# Patient Record
Sex: Female | Born: 1950 | Race: White | Hispanic: No | Marital: Married | Smoking: Never smoker
Health system: Southern US, Community
[De-identification: ages and names within clinical notes are randomized; demographics above are authoritative.]

## PROBLEM LIST (undated history)

## (undated) DIAGNOSIS — M549 Dorsalgia, unspecified: Secondary | ICD-10-CM

## (undated) DIAGNOSIS — E039 Hypothyroidism, unspecified: Secondary | ICD-10-CM

## (undated) DIAGNOSIS — M1990 Unspecified osteoarthritis, unspecified site: Secondary | ICD-10-CM

## (undated) DIAGNOSIS — M722 Plantar fascial fibromatosis: Secondary | ICD-10-CM

## (undated) DIAGNOSIS — M542 Cervicalgia: Secondary | ICD-10-CM

## (undated) DIAGNOSIS — R51 Headache: Secondary | ICD-10-CM

## (undated) DIAGNOSIS — R413 Other amnesia: Secondary | ICD-10-CM

## (undated) DIAGNOSIS — K635 Polyp of colon: Secondary | ICD-10-CM

## (undated) DIAGNOSIS — M7989 Other specified soft tissue disorders: Secondary | ICD-10-CM

## (undated) HISTORY — DX: Cervicalgia: M54.2

## (undated) HISTORY — DX: Other specified soft tissue disorders: M79.89

## (undated) HISTORY — DX: Hypothyroidism, unspecified: E03.9

## (undated) HISTORY — DX: Dorsalgia, unspecified: M54.9

## (undated) HISTORY — PX: COLONOSCOPY: SHX174

## (undated) HISTORY — PX: DILATION AND CURETTAGE OF UTERUS: SHX78

## (undated) HISTORY — DX: Headache: R51

## (undated) HISTORY — DX: Other amnesia: R41.3

## (undated) HISTORY — DX: Polyp of colon: K63.5

## (undated) SURGERY — COLONOSCOPY
Anesthesia: Moderate Sedation

---

## 2005-02-28 ENCOUNTER — Other Ambulatory Visit: Admission: RE | Admit: 2005-02-28 | Discharge: 2005-02-28 | Payer: Self-pay | Admitting: *Deleted

## 2005-07-03 ENCOUNTER — Other Ambulatory Visit: Admission: RE | Admit: 2005-07-03 | Discharge: 2005-07-03 | Payer: Self-pay | Admitting: Obstetrics and Gynecology

## 2005-07-15 ENCOUNTER — Ambulatory Visit (HOSPITAL_COMMUNITY): Admission: RE | Admit: 2005-07-15 | Discharge: 2005-07-15 | Payer: Self-pay | Admitting: Obstetrics and Gynecology

## 2005-08-01 ENCOUNTER — Ambulatory Visit (HOSPITAL_COMMUNITY): Admission: RE | Admit: 2005-08-01 | Discharge: 2005-08-01 | Payer: Self-pay | Admitting: Gastroenterology

## 2005-08-01 ENCOUNTER — Encounter (INDEPENDENT_AMBULATORY_CARE_PROVIDER_SITE_OTHER): Payer: Self-pay | Admitting: *Deleted

## 2007-04-30 ENCOUNTER — Encounter: Admission: RE | Admit: 2007-04-30 | Discharge: 2007-04-30 | Payer: Self-pay | Admitting: Obstetrics and Gynecology

## 2007-05-05 ENCOUNTER — Encounter: Admission: RE | Admit: 2007-05-05 | Discharge: 2007-05-05 | Payer: Self-pay | Admitting: Obstetrics and Gynecology

## 2008-08-06 ENCOUNTER — Emergency Department (HOSPITAL_COMMUNITY): Admission: EM | Admit: 2008-08-06 | Discharge: 2008-08-06 | Payer: Self-pay | Admitting: Emergency Medicine

## 2008-11-09 ENCOUNTER — Encounter: Admission: RE | Admit: 2008-11-09 | Discharge: 2008-11-09 | Payer: Self-pay | Admitting: Obstetrics and Gynecology

## 2010-05-09 ENCOUNTER — Encounter: Admission: RE | Admit: 2010-05-09 | Discharge: 2010-05-09 | Payer: Self-pay | Admitting: Obstetrics and Gynecology

## 2011-04-30 ENCOUNTER — Other Ambulatory Visit: Payer: Self-pay | Admitting: Podiatry

## 2011-04-30 DIAGNOSIS — M549 Dorsalgia, unspecified: Secondary | ICD-10-CM

## 2011-05-02 ENCOUNTER — Ambulatory Visit
Admission: RE | Admit: 2011-05-02 | Discharge: 2011-05-02 | Disposition: A | Discharge status: Home / Self Care | Payer: BC Managed Care – PPO | Source: Ambulatory Visit | Attending: Podiatry | Admitting: Podiatry

## 2011-05-02 DIAGNOSIS — M549 Dorsalgia, unspecified: Secondary | ICD-10-CM

## 2011-09-29 ENCOUNTER — Encounter: Payer: Self-pay | Admitting: Vascular Surgery

## 2011-10-07 ENCOUNTER — Encounter: Payer: BC Managed Care – PPO | Admitting: Vascular Surgery

## 2011-10-16 ENCOUNTER — Other Ambulatory Visit: Payer: Self-pay | Admitting: Obstetrics and Gynecology

## 2011-10-16 DIAGNOSIS — Z1231 Encounter for screening mammogram for malignant neoplasm of breast: Secondary | ICD-10-CM

## 2011-11-07 ENCOUNTER — Ambulatory Visit
Admission: RE | Admit: 2011-11-07 | Discharge: 2011-11-07 | Disposition: A | Discharge status: Home / Self Care | Payer: BC Managed Care – PPO | Source: Ambulatory Visit | Attending: Obstetrics and Gynecology | Admitting: Obstetrics and Gynecology

## 2011-11-07 DIAGNOSIS — Z1231 Encounter for screening mammogram for malignant neoplasm of breast: Secondary | ICD-10-CM

## 2011-11-17 ENCOUNTER — Encounter (INDEPENDENT_AMBULATORY_CARE_PROVIDER_SITE_OTHER): Payer: Self-pay | Admitting: Internal Medicine

## 2011-11-17 ENCOUNTER — Ambulatory Visit (INDEPENDENT_AMBULATORY_CARE_PROVIDER_SITE_OTHER): Payer: BC Managed Care – PPO | Admitting: Internal Medicine

## 2011-11-17 DIAGNOSIS — K649 Unspecified hemorrhoids: Secondary | ICD-10-CM

## 2011-11-17 DIAGNOSIS — E039 Hypothyroidism, unspecified: Secondary | ICD-10-CM

## 2011-11-17 MED ORDER — HYDROCORTISONE ACETATE 25 MG RE SUPP: 25.0000 mg | Freq: Two times a day (BID) | RECTAL | Status: AC

## 2011-11-17 MED ORDER — POLYETHYLENE GLYCOL 3350 17 G PO PACK: 17.0000 g | PACK | Freq: Every day | ORAL | Status: AC

## 2011-11-17 NOTE — Patient Instructions (Addendum)
MIralax. PR in 2 weeks.
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
## 2012-10-01 ENCOUNTER — Other Ambulatory Visit: Payer: Self-pay | Admitting: Obstetrics and Gynecology

## 2012-10-01 DIAGNOSIS — Z1231 Encounter for screening mammogram for malignant neoplasm of breast: Secondary | ICD-10-CM

## 2012-11-12 ENCOUNTER — Ambulatory Visit (HOSPITAL_COMMUNITY)
Admission: RE | Admit: 2012-11-12 | Discharge: 2012-11-12 | Disposition: A | Discharge status: Home / Self Care | Payer: BC Managed Care – PPO | Source: Ambulatory Visit | Attending: Obstetrics and Gynecology | Admitting: Obstetrics and Gynecology

## 2012-11-12 DIAGNOSIS — Z1231 Encounter for screening mammogram for malignant neoplasm of breast: Secondary | ICD-10-CM | POA: Insufficient documentation

## 2013-10-12 ENCOUNTER — Other Ambulatory Visit (HOSPITAL_COMMUNITY): Payer: Self-pay | Admitting: Internal Medicine

## 2013-10-12 DIAGNOSIS — Z1231 Encounter for screening mammogram for malignant neoplasm of breast: Secondary | ICD-10-CM

## 2013-11-25 ENCOUNTER — Ambulatory Visit (HOSPITAL_COMMUNITY)
Admission: RE | Admit: 2013-11-25 | Discharge: 2013-11-25 | Disposition: A | Discharge status: Home / Self Care | Payer: BC Managed Care – PPO | Source: Ambulatory Visit | Attending: Internal Medicine | Admitting: Internal Medicine

## 2013-11-25 DIAGNOSIS — Z1231 Encounter for screening mammogram for malignant neoplasm of breast: Secondary | ICD-10-CM

## 2015-01-08 ENCOUNTER — Other Ambulatory Visit (HOSPITAL_COMMUNITY): Payer: Self-pay | Admitting: Obstetrics and Gynecology

## 2015-01-08 DIAGNOSIS — Z1231 Encounter for screening mammogram for malignant neoplasm of breast: Secondary | ICD-10-CM

## 2015-01-12 ENCOUNTER — Ambulatory Visit (HOSPITAL_COMMUNITY): Admission: RE | Admit: 2015-01-12 | Payer: Self-pay | Source: Ambulatory Visit

## 2015-01-17 ENCOUNTER — Ambulatory Visit (HOSPITAL_COMMUNITY)
Admission: RE | Admit: 2015-01-17 | Discharge: 2015-01-17 | Disposition: A | Discharge status: Home / Self Care | Payer: 59 | Source: Ambulatory Visit | Attending: Obstetrics and Gynecology | Admitting: Obstetrics and Gynecology

## 2015-01-17 DIAGNOSIS — Z1231 Encounter for screening mammogram for malignant neoplasm of breast: Secondary | ICD-10-CM | POA: Diagnosis present

## 2015-09-23 ENCOUNTER — Emergency Department (HOSPITAL_COMMUNITY): Payer: BLUE CROSS/BLUE SHIELD

## 2015-09-23 ENCOUNTER — Emergency Department (HOSPITAL_COMMUNITY)
Admission: EM | Admit: 2015-09-23 | Discharge: 2015-09-24 | Disposition: A | Discharge status: Home / Self Care | Payer: BLUE CROSS/BLUE SHIELD | Attending: Emergency Medicine | Admitting: Emergency Medicine

## 2015-09-23 ENCOUNTER — Encounter (HOSPITAL_COMMUNITY): Payer: Self-pay | Admitting: *Deleted

## 2015-09-23 DIAGNOSIS — Z8739 Personal history of other diseases of the musculoskeletal system and connective tissue: Secondary | ICD-10-CM | POA: Diagnosis not present

## 2015-09-23 DIAGNOSIS — E039 Hypothyroidism, unspecified: Secondary | ICD-10-CM | POA: Insufficient documentation

## 2015-09-23 DIAGNOSIS — Z79899 Other long term (current) drug therapy: Secondary | ICD-10-CM | POA: Diagnosis not present

## 2015-09-23 DIAGNOSIS — N390 Urinary tract infection, site not specified: Secondary | ICD-10-CM | POA: Diagnosis not present

## 2015-09-23 DIAGNOSIS — R1011 Right upper quadrant pain: Secondary | ICD-10-CM | POA: Diagnosis not present

## 2015-09-23 DIAGNOSIS — R197 Diarrhea, unspecified: Secondary | ICD-10-CM | POA: Insufficient documentation

## 2015-09-23 LAB — CBC WITH DIFFERENTIAL/PLATELET
BASOS ABS: 0 10*3/uL (ref 0.0–0.1)
Basophils Relative: 1 %
EOS ABS: 0.2 10*3/uL (ref 0.0–0.7)
EOS PCT: 3 %
HCT: 40.3 % (ref 36.0–46.0)
HEMOGLOBIN: 13.9 g/dL (ref 12.0–15.0)
Lymphocytes Relative: 33 %
Lymphs Abs: 2.2 10*3/uL (ref 0.7–4.0)
MCH: 32.7 pg (ref 26.0–34.0)
MCHC: 34.5 g/dL (ref 30.0–36.0)
MCV: 94.8 fL (ref 78.0–100.0)
Monocytes Absolute: 0.6 10*3/uL (ref 0.1–1.0)
Monocytes Relative: 9 %
NEUTROS PCT: 54 %
Neutro Abs: 3.6 10*3/uL (ref 1.7–7.7)
PLATELETS: 260 10*3/uL (ref 150–400)
RBC: 4.25 MIL/uL (ref 3.87–5.11)
RDW: 12.8 % (ref 11.5–15.5)
WBC: 6.6 10*3/uL (ref 4.0–10.5)

## 2015-09-23 LAB — URINALYSIS, ROUTINE W REFLEX MICROSCOPIC
BILIRUBIN URINE: NEGATIVE
Glucose, UA: NEGATIVE mg/dL
Hgb urine dipstick: NEGATIVE
Ketones, ur: NEGATIVE mg/dL
NITRITE: NEGATIVE
PH: 6.5 (ref 5.0–8.0)
Protein, ur: NEGATIVE mg/dL
SPECIFIC GRAVITY, URINE: 1.015 (ref 1.005–1.030)
UROBILINOGEN UA: 0.2 mg/dL (ref 0.0–1.0)

## 2015-09-23 LAB — COMPREHENSIVE METABOLIC PANEL
ALBUMIN: 4.2 g/dL (ref 3.5–5.0)
ALK PHOS: 112 U/L (ref 38–126)
ALT: 35 U/L (ref 14–54)
AST: 37 U/L (ref 15–41)
Anion gap: 9 (ref 5–15)
BUN: 18 mg/dL (ref 6–20)
CHLORIDE: 104 mmol/L (ref 101–111)
CO2: 26 mmol/L (ref 22–32)
Calcium: 9.5 mg/dL (ref 8.9–10.3)
Creatinine, Ser: 0.69 mg/dL (ref 0.44–1.00)
GFR calc Af Amer: >60 mL/min (ref >60)
GFR calc non Af Amer: >60 mL/min (ref >60)
GLUCOSE: 146 mg/dL — AB (ref 65–99)
Potassium: 3.8 mmol/L (ref 3.5–5.1)
SODIUM: 139 mmol/L (ref 135–145)
Total Bilirubin: 0.3 mg/dL (ref 0.3–1.2)
Total Protein: 7.5 g/dL (ref 6.5–8.1)

## 2015-09-23 LAB — URINE MICROSCOPIC-ADD ON

## 2015-09-23 LAB — LIPASE, BLOOD: Lipase: 31 U/L (ref 22–51)

## 2015-09-23 MED ORDER — IOHEXOL 300 MG/ML  SOLN
25.0000 mL | Freq: Once | INTRAMUSCULAR | Status: AC | PRN
  Administered 2015-09-23: 25 mL via ORAL

## 2015-09-23 MED ORDER — HYDROCODONE-ACETAMINOPHEN 5-325 MG PO TABS: 2.0000 | ORAL_TABLET | ORAL | Status: DC | PRN

## 2015-09-23 MED ORDER — SODIUM CHLORIDE 0.9 % IV BOLUS (SEPSIS)
500.0000 mL | Freq: Once | INTRAVENOUS | Status: AC
  Administered 2015-09-23: 20:00:00 via INTRAVENOUS

## 2015-09-23 MED ORDER — CEPHALEXIN 500 MG PO CAPS: 500.0000 mg | ORAL_CAPSULE | Freq: Four times a day (QID) | ORAL | Status: DC

## 2015-09-23 MED ORDER — ONDANSETRON HCL 4 MG PO TABS: 4.0000 mg | ORAL_TABLET | Freq: Four times a day (QID) | ORAL | Status: DC

## 2015-09-23 MED ORDER — IOHEXOL 300 MG/ML  SOLN
100.0000 mL | Freq: Once | INTRAMUSCULAR | Status: AC | PRN
  Administered 2015-09-23: 100 mL via INTRAVENOUS

## 2015-09-23 MED ORDER — RANITIDINE HCL 150 MG PO TABS: 150.0000 mg | ORAL_TABLET | Freq: Two times a day (BID) | ORAL | Status: DC

## 2015-09-23 MED ORDER — ONDANSETRON HCL 4 MG/2ML IJ SOLN
4.0000 mg | Freq: Once | INTRAMUSCULAR | Status: AC
  Administered 2015-09-23: 4 mg via INTRAVENOUS
  Filled 2015-09-23: qty 2

## 2015-09-23 MED ORDER — HYDROMORPHONE HCL 1 MG/ML IJ SOLN
1.0000 mg | Freq: Once | INTRAMUSCULAR | Status: AC
  Administered 2015-09-23: 1 mg via INTRAVENOUS
  Filled 2015-09-23: qty 1
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
## 2015-09-23 NOTE — ED Notes (Signed)
Patient now moving only his right side  And keeping his eyes open but still not talking.

## 2015-09-23 NOTE — ED Notes (Addendum)
Pt c/o right side pain x 2 days with nausea.

## 2015-09-23 NOTE — Discharge Instructions (Signed)

## 2016-09-19 ENCOUNTER — Encounter (INDEPENDENT_AMBULATORY_CARE_PROVIDER_SITE_OTHER): Payer: Self-pay | Admitting: Internal Medicine

## 2016-09-29 ENCOUNTER — Encounter (INDEPENDENT_AMBULATORY_CARE_PROVIDER_SITE_OTHER): Payer: Self-pay | Admitting: Internal Medicine

## 2016-09-29 ENCOUNTER — Ambulatory Visit (INDEPENDENT_AMBULATORY_CARE_PROVIDER_SITE_OTHER): Payer: BLUE CROSS/BLUE SHIELD | Admitting: Internal Medicine

## 2016-09-29 ENCOUNTER — Other Ambulatory Visit (INDEPENDENT_AMBULATORY_CARE_PROVIDER_SITE_OTHER): Payer: Self-pay | Admitting: *Deleted

## 2016-09-29 VITALS — BP 152/70 | HR 76 | Temp 97.4°F | Ht 67.0 in | Wt 244.4 lb

## 2016-09-29 DIAGNOSIS — D126 Benign neoplasm of colon, unspecified: Secondary | ICD-10-CM | POA: Diagnosis not present

## 2016-09-29 NOTE — Addendum Note (Signed)
Addended by: Butch Penny on: 09/29/2016 12:18 PM   Modules accepted: Orders

## 2016-09-29 NOTE — Patient Instructions (Signed)
Colonoscopy.  The risks and benefits such as perforation, bleeding, and infection were reviewed with the patient and is agreeable. 
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
## 2016-10-10 ENCOUNTER — Other Ambulatory Visit (INDEPENDENT_AMBULATORY_CARE_PROVIDER_SITE_OTHER): Payer: Self-pay | Admitting: Internal Medicine

## 2016-10-10 ENCOUNTER — Telehealth (INDEPENDENT_AMBULATORY_CARE_PROVIDER_SITE_OTHER): Payer: Self-pay | Admitting: Internal Medicine

## 2016-10-10 DIAGNOSIS — Z86010 Personal history of colonic polyps: Secondary | ICD-10-CM

## 2016-10-10 NOTE — Telephone Encounter (Signed)
Colonoscopy with colowrap

## 2016-10-13 NOTE — Telephone Encounter (Signed)
Per Terri cancel TCS, she spoke to patient and patient states she isn't having any problems and does not want TCS -- TCS has been canceled

## 2016-10-15 NOTE — Telephone Encounter (Signed)
error 

## 2016-11-10 ENCOUNTER — Encounter (INDEPENDENT_AMBULATORY_CARE_PROVIDER_SITE_OTHER): Payer: Self-pay

## 2018-01-07 DIAGNOSIS — M6702 Short Achilles tendon (acquired), left ankle: Secondary | ICD-10-CM | POA: Diagnosis not present

## 2018-01-07 DIAGNOSIS — M722 Plantar fascial fibromatosis: Secondary | ICD-10-CM | POA: Diagnosis not present

## 2018-01-07 DIAGNOSIS — B353 Tinea pedis: Secondary | ICD-10-CM | POA: Diagnosis not present

## 2018-02-09 ENCOUNTER — Telehealth (INDEPENDENT_AMBULATORY_CARE_PROVIDER_SITE_OTHER): Payer: Self-pay | Admitting: *Deleted

## 2018-02-09 NOTE — Telephone Encounter (Signed)
OV sch'd 02/15/18, left detailed message for patient

## 2018-02-09 NOTE — Telephone Encounter (Signed)
Patient left message wanting to schedule TCS -- looks like we had her schedule endo of last year but you had me cancel -- please advise

## 2018-02-09 NOTE — Telephone Encounter (Signed)
Needs OV before we schedule

## 2018-02-15 ENCOUNTER — Encounter (INDEPENDENT_AMBULATORY_CARE_PROVIDER_SITE_OTHER): Payer: Self-pay | Admitting: Internal Medicine

## 2018-02-15 ENCOUNTER — Ambulatory Visit (INDEPENDENT_AMBULATORY_CARE_PROVIDER_SITE_OTHER): Payer: Medicare HMO | Admitting: Internal Medicine

## 2018-02-15 ENCOUNTER — Telehealth (INDEPENDENT_AMBULATORY_CARE_PROVIDER_SITE_OTHER): Payer: Self-pay | Admitting: *Deleted

## 2018-02-15 ENCOUNTER — Encounter (INDEPENDENT_AMBULATORY_CARE_PROVIDER_SITE_OTHER): Payer: Self-pay | Admitting: *Deleted

## 2018-02-15 VITALS — BP 160/90 | HR 64 | Temp 98.0°F | Ht 67.0 in | Wt 232.0 lb

## 2018-02-15 DIAGNOSIS — Z86010 Personal history of colonic polyps: Secondary | ICD-10-CM | POA: Diagnosis not present

## 2018-02-15 DIAGNOSIS — K625 Hemorrhage of anus and rectum: Secondary | ICD-10-CM

## 2018-02-15 MED ORDER — PEG 3350-KCL-NA BICARB-NACL 420 G PO SOLR: 4000.0000 mL | Freq: Once | ORAL | 0 refills | Status: AC

## 2018-02-15 NOTE — Telephone Encounter (Signed)
Patient need trilyte 

## 2018-02-15 NOTE — Patient Instructions (Signed)
The risks of bleeding, perforation and infection were reviewed with patient.  
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
## 2018-02-26 ENCOUNTER — Other Ambulatory Visit: Payer: Self-pay

## 2018-02-26 ENCOUNTER — Ambulatory Visit (HOSPITAL_COMMUNITY)
Admission: RE | Admit: 2018-02-26 | Discharge: 2018-02-26 | Disposition: A | Discharge status: Home / Self Care | Payer: Medicare HMO | Source: Ambulatory Visit | Attending: Internal Medicine | Admitting: Internal Medicine

## 2018-02-26 ENCOUNTER — Encounter (HOSPITAL_COMMUNITY): Payer: Self-pay | Admitting: *Deleted

## 2018-02-26 ENCOUNTER — Encounter (HOSPITAL_COMMUNITY)
Admission: RE | Disposition: A | Discharge status: Home / Self Care | Payer: Self-pay | Source: Ambulatory Visit | Attending: Internal Medicine

## 2018-02-26 DIAGNOSIS — Z79899 Other long term (current) drug therapy: Secondary | ICD-10-CM | POA: Insufficient documentation

## 2018-02-26 DIAGNOSIS — Z79890 Hormone replacement therapy (postmenopausal): Secondary | ICD-10-CM | POA: Diagnosis not present

## 2018-02-26 DIAGNOSIS — Z86010 Personal history of colonic polyps: Secondary | ICD-10-CM | POA: Insufficient documentation

## 2018-02-26 DIAGNOSIS — K648 Other hemorrhoids: Secondary | ICD-10-CM | POA: Insufficient documentation

## 2018-02-26 DIAGNOSIS — Z91048 Other nonmedicinal substance allergy status: Secondary | ICD-10-CM | POA: Insufficient documentation

## 2018-02-26 DIAGNOSIS — K644 Residual hemorrhoidal skin tags: Secondary | ICD-10-CM | POA: Diagnosis not present

## 2018-02-26 DIAGNOSIS — Z1211 Encounter for screening for malignant neoplasm of colon: Secondary | ICD-10-CM | POA: Diagnosis not present

## 2018-02-26 DIAGNOSIS — Z09 Encounter for follow-up examination after completed treatment for conditions other than malignant neoplasm: Secondary | ICD-10-CM | POA: Diagnosis not present

## 2018-02-26 DIAGNOSIS — E039 Hypothyroidism, unspecified: Secondary | ICD-10-CM | POA: Insufficient documentation

## 2018-02-26 HISTORY — DX: Unspecified osteoarthritis, unspecified site: M19.90

## 2018-02-26 HISTORY — PX: COLONOSCOPY: SHX5424

## 2018-02-26 HISTORY — DX: Plantar fascial fibromatosis: M72.2

## 2018-02-26 SURGERY — COLONOSCOPY
Anesthesia: Moderate Sedation

## 2018-02-26 MED ORDER — MIDAZOLAM HCL 5 MG/5ML IJ SOLN
INTRAMUSCULAR | Status: DC | PRN
  Administered 2018-02-26 (×3): 2 mg via INTRAVENOUS

## 2018-02-26 MED ORDER — MEPERIDINE HCL 50 MG/ML IJ SOLN
INTRAMUSCULAR | Status: DC | PRN
  Administered 2018-02-26 (×2): 25 mg

## 2018-02-26 MED ORDER — MEPERIDINE HCL 50 MG/ML IJ SOLN
INTRAMUSCULAR | Status: AC
  Filled 2018-02-26: qty 1

## 2018-02-26 MED ORDER — HYDROCORTISONE ACETATE 25 MG RE SUPP: 25.0000 mg | Freq: Every day | RECTAL | 1 refills | Status: DC

## 2018-02-26 MED ORDER — MIDAZOLAM HCL 5 MG/5ML IJ SOLN
INTRAMUSCULAR | Status: AC
  Filled 2018-02-26: qty 10

## 2018-02-26 MED ORDER — SODIUM CHLORIDE 0.9 % IV SOLN
INTRAVENOUS | Status: DC
  Administered 2018-02-26: 10:00:00 via INTRAVENOUS

## 2018-02-26 NOTE — Discharge Instructions (Signed)
Resume usual medications and diet as before. Anusol HC suppository 1 per rectum daily at bedtime for 2 weeks. No driving for 24 hours. Keep symptom diary as to frequency of bleeding episodes for the next 1 month and call with progress report.   Colonoscopy, Adult, Care After This sheet gives you information about how to care for yourself after your procedure. Your health care provider may also give you more specific instructions. If you have problems or questions, contact your health care provider. Dr Laural Golden:  [phone].  After hours and weekends call the hospital and have the GI doctor on call paged; They will call you back. What can I expect after the procedure? After the procedure, it is common to have:  A small amount of blood in your stool for 24 hours after the procedure.  Some gas.  Mild abdominal cramping or bloating.  Follow these instructions at home: General instructions   For the first 24 hours after the procedure: ? Do not drive or use machinery. ? Do not sign important documents. ? Do not drink alcohol. ? Rest often.  Take over-the-counter or prescription medicines only as told by your health care provider. Relieving cramping and bloating  Try walking around when you have cramps or feel bloated. Eating and drinking  Drink enough fluid to keep your urine clear or pale yellow.  Resume your normal diet as instructed by your health care provider.  Avoid drinking alcohol for as long as instructed by your health care provider. Contact a health care provider if:  You have blood in your stool 2-3 days after the procedure. Get help right away if:  You have more than a small spotting of blood in your stool.  You pass large blood clots in your stool.  Your abdomen is swollen.  You have nausea or vomiting.  You have a fever.  You have increasing abdominal pain that is not relieved with medicine. This information is not intended to replace advice given to you  by your health care provider. Make sure you discuss any questions you have with your health care provider. Document Released: 07/22/2004 Document Revised: 09/01/2016 Document Reviewed: 02/19/2016 Elsevier Interactive Patient Education  [employer].
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
## 2018-02-26 NOTE — Op Note (Signed)
[facility] Patient Name: Donna Gaines Procedure Date: 02/26/2018 10:07 AM MRN: [id number] Date of Birth: 1951/12/11 Attending MD: Hildred Laser , MD CSN: [id number] Age: 67 Admit Type: Outpatient Procedure:                Colonoscopy Indications:              High risk colon cancer surveillance: Personal                            history of colonic polyps Providers:                Hildred Laser, MD, Janeece Riggers, RN, Rosina Lowenstein, RN Referring MD:             Glenda Chroman, MD Medicines:                Meperidine 50 mg IV, Midazolam 6 mg IV Complications:            No immediate complications. Estimated Blood Loss:     Estimated blood loss: none. Procedure:                Pre-Anesthesia Assessment:                           - Prior to the procedure, a History and Physical                            was performed, and patient medications and                            allergies were reviewed. The patient's tolerance of                            previous anesthesia was also reviewed. The risks                            and benefits of the procedure and the sedation                            options and risks were discussed with the patient.                            All questions were answered, and informed consent                            was obtained. Prior Anticoagulants: The patient has                            taken no previous anticoagulant or antiplatelet                            agents. ASA Grade Assessment: I - A normal, healthy                            patient. After reviewing the risks and benefits,  the patient was deemed in satisfactory condition to                            undergo the procedure.                           After obtaining informed consent, the colonoscope                            was passed under direct vision. Throughout the                            procedure, the patient's blood pressure, pulse, and                          oxygen saturations were monitored continuously. The                            EC-[id number] ([id number]) scope was introduced through                            the anus and advanced to the the cecum, identified                            by appendiceal orifice and ileocecal valve. The                            quality of the bowel preparation was good. The                            ileocecal valve, appendiceal orifice, and rectum                            were photographed. The colonoscopy was technically                            difficult and complex due to a tortuous colon.                            Successful completion of the procedure was aided by                            increasing the dose of sedation medication,                            changing the patient to a supine position, using                            manual pressure and withdrawing and reinserting the                            scope. Scope In: 10:35:58 AM Scope Out: 11:02:53 AM Scope Withdrawal Time: 0 hours 9 minutes 4 seconds  Total Procedure Duration: 0 hours 26 minutes 55 seconds  Findings:      The perianal and digital rectal examinations were normal.      The colon (entire examined portion) appeared normal.      External and internal hemorrhoids were found during retroflexion. The       hemorrhoids were small. Impression:               - The entire examined colon is normal.                           - External and internal hemorrhoids.                           - No specimens collected. Moderate Sedation:      Moderate (conscious) sedation was administered by the endoscopy nurse       and supervised by the endoscopist. The following parameters were       monitored: oxygen saturation, heart rate, blood pressure, CO2       capnography and response to care. Total physician intraservice time was       32 minutes. Recommendation:           - Patient has a contact number available for                             emergencies. The signs and symptoms of potential                            delayed complications were discussed with the                            patient. Return to normal activities tomorrow.                            Written discharge instructions were provided to the                            patient.                           - Resume previous diet today.                           - Continue present medications.                           - Anusol- HC supp one per rectum qhs for 2 weeks.                           - Repeat colonoscopy in 10 years. Procedure Code(s):        --- Professional ---                           [id number], Colonoscopy, flexible; diagnostic, including                            collection of specimen(s) by brushing or washing,  when performed (separate procedure)                           99152, Moderate sedation services provided by the                            same physician or other qualified health care                            professional performing the diagnostic or                            therapeutic service that the sedation supports,                            requiring the presence of an independent trained                            observer to assist in the monitoring of the                            patient's level of consciousness and physiological                            status; initial 15 minutes of intraservice time,                            patient age 42 years or older                           [phone], Moderate sedation services; each additional                            15 minutes intraservice time Diagnosis Code(s):        --- Professional ---                           Z86.010, Personal history of colonic polyps                           K64.8, Other hemorrhoids CPT copyright 2016 American [employer] Association. All rights reserved. The codes documented in this report are  preliminary and upon coder review may  be revised to meet current compliance requirements. Hildred Laser, MD Hildred Laser, MD 02/26/2018 11:11:33 AM This report has been signed electronically. Number of Addenda: 0

## 2018-03-03 ENCOUNTER — Encounter (HOSPITAL_COMMUNITY): Payer: Self-pay | Admitting: Internal Medicine

## 2018-03-04 DIAGNOSIS — R69 Illness, unspecified: Secondary | ICD-10-CM | POA: Diagnosis not present

## 2018-07-28 DIAGNOSIS — Z1339 Encounter for screening examination for other mental health and behavioral disorders: Secondary | ICD-10-CM | POA: Diagnosis not present

## 2018-07-28 DIAGNOSIS — E039 Hypothyroidism, unspecified: Secondary | ICD-10-CM | POA: Diagnosis not present

## 2018-07-28 DIAGNOSIS — Z299 Encounter for prophylactic measures, unspecified: Secondary | ICD-10-CM | POA: Diagnosis not present

## 2018-07-28 DIAGNOSIS — Z6836 Body mass index (BMI) 36.0-36.9, adult: Secondary | ICD-10-CM | POA: Diagnosis not present

## 2018-07-28 DIAGNOSIS — Z0000 Encounter for general adult medical examination without abnormal findings: Secondary | ICD-10-CM | POA: Diagnosis not present

## 2018-07-28 DIAGNOSIS — Z79899 Other long term (current) drug therapy: Secondary | ICD-10-CM | POA: Diagnosis not present

## 2018-07-28 DIAGNOSIS — Z1331 Encounter for screening for depression: Secondary | ICD-10-CM | POA: Diagnosis not present

## 2018-07-28 DIAGNOSIS — E7800 Pure hypercholesterolemia, unspecified: Secondary | ICD-10-CM | POA: Diagnosis not present

## 2018-07-28 DIAGNOSIS — Z7189 Other specified counseling: Secondary | ICD-10-CM | POA: Diagnosis not present

## 2019-07-29 DIAGNOSIS — Z0100 Encounter for examination of eyes and vision without abnormal findings: Secondary | ICD-10-CM | POA: Diagnosis not present

## 2019-10-04 DIAGNOSIS — E7800 Pure hypercholesterolemia, unspecified: Secondary | ICD-10-CM | POA: Diagnosis not present

## 2019-10-04 DIAGNOSIS — Z6838 Body mass index (BMI) 38.0-38.9, adult: Secondary | ICD-10-CM | POA: Diagnosis not present

## 2019-10-04 DIAGNOSIS — E039 Hypothyroidism, unspecified: Secondary | ICD-10-CM | POA: Diagnosis not present

## 2019-10-04 DIAGNOSIS — E2839 Other primary ovarian failure: Secondary | ICD-10-CM | POA: Diagnosis not present

## 2019-10-04 DIAGNOSIS — Z1331 Encounter for screening for depression: Secondary | ICD-10-CM | POA: Diagnosis not present

## 2019-10-04 DIAGNOSIS — Z1339 Encounter for screening examination for other mental health and behavioral disorders: Secondary | ICD-10-CM | POA: Diagnosis not present

## 2019-10-04 DIAGNOSIS — Z79899 Other long term (current) drug therapy: Secondary | ICD-10-CM | POA: Diagnosis not present

## 2019-10-04 DIAGNOSIS — Z0000 Encounter for general adult medical examination without abnormal findings: Secondary | ICD-10-CM | POA: Diagnosis not present

## 2019-10-04 DIAGNOSIS — Z7189 Other specified counseling: Secondary | ICD-10-CM | POA: Diagnosis not present

## 2019-10-04 DIAGNOSIS — Z299 Encounter for prophylactic measures, unspecified: Secondary | ICD-10-CM | POA: Diagnosis not present

## 2019-10-06 ENCOUNTER — Other Ambulatory Visit: Payer: Self-pay | Admitting: Nurse Practitioner

## 2019-10-06 DIAGNOSIS — Z1231 Encounter for screening mammogram for malignant neoplasm of breast: Secondary | ICD-10-CM

## 2019-10-12 DIAGNOSIS — E1165 Type 2 diabetes mellitus with hyperglycemia: Secondary | ICD-10-CM | POA: Diagnosis not present

## 2019-10-12 DIAGNOSIS — E785 Hyperlipidemia, unspecified: Secondary | ICD-10-CM | POA: Diagnosis not present

## 2019-10-12 DIAGNOSIS — E039 Hypothyroidism, unspecified: Secondary | ICD-10-CM | POA: Diagnosis not present

## 2019-10-12 DIAGNOSIS — Z789 Other specified health status: Secondary | ICD-10-CM | POA: Diagnosis not present

## 2019-10-12 DIAGNOSIS — Z299 Encounter for prophylactic measures, unspecified: Secondary | ICD-10-CM | POA: Diagnosis not present

## 2019-10-12 DIAGNOSIS — Z6838 Body mass index (BMI) 38.0-38.9, adult: Secondary | ICD-10-CM | POA: Diagnosis not present

## 2019-10-19 ENCOUNTER — Ambulatory Visit
Admission: RE | Admit: 2019-10-19 | Discharge: 2019-10-19 | Disposition: A | Discharge status: Home / Self Care | Payer: Medicare HMO | Source: Ambulatory Visit | Attending: Nurse Practitioner | Admitting: Nurse Practitioner

## 2019-10-19 ENCOUNTER — Other Ambulatory Visit: Payer: Self-pay

## 2019-10-19 DIAGNOSIS — Z1231 Encounter for screening mammogram for malignant neoplasm of breast: Secondary | ICD-10-CM

## 2019-11-03 DIAGNOSIS — E119 Type 2 diabetes mellitus without complications: Secondary | ICD-10-CM | POA: Diagnosis not present

## 2019-11-22 DIAGNOSIS — R69 Illness, unspecified: Secondary | ICD-10-CM | POA: Diagnosis not present

## 2020-01-08 DIAGNOSIS — R69 Illness, unspecified: Secondary | ICD-10-CM | POA: Diagnosis not present

## 2020-01-20 DIAGNOSIS — Z6837 Body mass index (BMI) 37.0-37.9, adult: Secondary | ICD-10-CM | POA: Diagnosis not present

## 2020-01-20 DIAGNOSIS — E1165 Type 2 diabetes mellitus with hyperglycemia: Secondary | ICD-10-CM | POA: Diagnosis not present

## 2020-01-20 DIAGNOSIS — Z713 Dietary counseling and surveillance: Secondary | ICD-10-CM | POA: Diagnosis not present

## 2020-01-20 DIAGNOSIS — Z79899 Other long term (current) drug therapy: Secondary | ICD-10-CM | POA: Diagnosis not present

## 2020-01-20 DIAGNOSIS — E7800 Pure hypercholesterolemia, unspecified: Secondary | ICD-10-CM | POA: Diagnosis not present

## 2020-01-20 DIAGNOSIS — Z299 Encounter for prophylactic measures, unspecified: Secondary | ICD-10-CM | POA: Diagnosis not present

## 2020-02-01 ENCOUNTER — Ambulatory Visit: Payer: Medicare HMO | Attending: Internal Medicine

## 2020-02-01 ENCOUNTER — Other Ambulatory Visit: Payer: Self-pay

## 2020-02-01 DIAGNOSIS — Z20822 Contact with and (suspected) exposure to covid-19: Secondary | ICD-10-CM

## 2020-02-02 LAB — NOVEL CORONAVIRUS, NAA: SARS-CoV-2, NAA: NOT DETECTED

## 2020-02-16 DIAGNOSIS — E1165 Type 2 diabetes mellitus with hyperglycemia: Secondary | ICD-10-CM | POA: Diagnosis not present

## 2020-03-01 DIAGNOSIS — Z2821 Immunization not carried out because of patient refusal: Secondary | ICD-10-CM | POA: Diagnosis not present

## 2020-03-01 DIAGNOSIS — Z299 Encounter for prophylactic measures, unspecified: Secondary | ICD-10-CM | POA: Diagnosis not present

## 2020-03-01 DIAGNOSIS — E7800 Pure hypercholesterolemia, unspecified: Secondary | ICD-10-CM | POA: Diagnosis not present

## 2020-03-01 DIAGNOSIS — Z789 Other specified health status: Secondary | ICD-10-CM | POA: Diagnosis not present

## 2020-03-20 DIAGNOSIS — E1165 Type 2 diabetes mellitus with hyperglycemia: Secondary | ICD-10-CM | POA: Diagnosis not present

## 2020-04-02 DIAGNOSIS — Z299 Encounter for prophylactic measures, unspecified: Secondary | ICD-10-CM | POA: Diagnosis not present

## 2020-04-02 DIAGNOSIS — J45909 Unspecified asthma, uncomplicated: Secondary | ICD-10-CM | POA: Diagnosis not present

## 2020-04-02 DIAGNOSIS — Z713 Dietary counseling and surveillance: Secondary | ICD-10-CM | POA: Diagnosis not present

## 2020-04-02 DIAGNOSIS — Z6836 Body mass index (BMI) 36.0-36.9, adult: Secondary | ICD-10-CM | POA: Diagnosis not present

## 2020-04-18 ENCOUNTER — Encounter: Payer: Self-pay | Admitting: Pulmonary Disease

## 2020-04-18 ENCOUNTER — Ambulatory Visit: Payer: Medicare HMO | Admitting: Pulmonary Disease

## 2020-04-18 ENCOUNTER — Other Ambulatory Visit: Payer: Self-pay

## 2020-04-18 DIAGNOSIS — J4530 Mild persistent asthma, uncomplicated: Secondary | ICD-10-CM | POA: Diagnosis not present

## 2020-04-18 DIAGNOSIS — J45909 Unspecified asthma, uncomplicated: Secondary | ICD-10-CM | POA: Insufficient documentation

## 2020-04-18 MED ORDER — ALBUTEROL SULFATE HFA 108 (90 BASE) MCG/ACT IN AERS: 2.0000 | INHALATION_SPRAY | Freq: Four times a day (QID) | RESPIRATORY_TRACT | 3 refills | Status: DC | PRN
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
## 2020-04-18 NOTE — Assessment & Plan Note (Addendum)
may have reactive airway disease or rarely it is possible that piece of corn is still there aspirated in the lung Triggers- PND, reflux, cats  I offered her bronchoscopy but se prefers to do non invasive measures first.  Trial of albuterol MDI 2 puffs every 6 hours as needed -use spacer with this Report back in 2 weeks  If no improvement, can add steroid inhaler or consider CT scan and bronchoscopy as we discussed If persistent & above wu neg then will consider allergy testing

## 2020-04-18 NOTE — Patient Instructions (Signed)
You may have reactive airway disease or rarely it is possible that piece of corn is still there aspirated in the lung  Trial of albuterol MDI 2 puffs every 6 hours as needed -use spacer with this Report back in 2 weeks  If no improvement, can add steroid inhaler or consider CT scan and bronchoscopy as we discussed

## 2020-04-18 NOTE — Addendum Note (Signed)
Addended by: Lia Foyer R on: 04/18/2020 10:08 AM   Modules accepted: Orders

## 2020-04-20 DIAGNOSIS — E1165 Type 2 diabetes mellitus with hyperglycemia: Secondary | ICD-10-CM | POA: Diagnosis not present

## 2020-04-27 DIAGNOSIS — E039 Hypothyroidism, unspecified: Secondary | ICD-10-CM | POA: Diagnosis not present

## 2020-04-27 DIAGNOSIS — Z6836 Body mass index (BMI) 36.0-36.9, adult: Secondary | ICD-10-CM | POA: Diagnosis not present

## 2020-04-27 DIAGNOSIS — E1165 Type 2 diabetes mellitus with hyperglycemia: Secondary | ICD-10-CM | POA: Diagnosis not present

## 2020-04-27 DIAGNOSIS — Z299 Encounter for prophylactic measures, unspecified: Secondary | ICD-10-CM | POA: Diagnosis not present

## 2020-05-07 ENCOUNTER — Telehealth: Payer: Self-pay | Admitting: Pulmonary Disease

## 2020-05-07 DIAGNOSIS — J4530 Mild persistent asthma, uncomplicated: Secondary | ICD-10-CM

## 2020-05-07 NOTE — Telephone Encounter (Signed)
Instructions from last OV 04/18/20 with RA    Return in about 4 weeks (around 05/16/2020).  You may have reactive airway disease or rarely it is possible that piece of corn is still there aspirated in the lung  Trial of albuterol MDI 2 puffs every 6 hours as needed -use spacer with this Report back in 2 weeks  If no improvement, can add steroid inhaler or consider CT scan and bronchoscopy as we discussed     Called pt but unable to reach. Left message for her to return call.

## 2020-05-07 NOTE — Telephone Encounter (Signed)
Has been taking mucinex to see if that would help

## 2020-05-08 NOTE — Telephone Encounter (Signed)
Spoke with the pt. She states needing to report to Dr Elsworth Soho that she is using her albuterol inhaler about 2 x per day for wheezing and has not noticed any improvement. She states that she has noticed when in the shower she wheezes and coughs more- non prod  Did you want to try the steroid inhaler or order ct or bronch as mentioned last AVS?  Please advise, thanks!

## 2020-05-08 NOTE — Telephone Encounter (Signed)
LMTCB x2 for pt 

## 2020-05-09 NOTE — Telephone Encounter (Signed)
Proceed with CT chest without contrast -to rule out aspirated foreign body

## 2020-05-09 NOTE — Telephone Encounter (Signed)
ATC patient to inform her CT was being ordered. Per DPR left detailed message letting patient know that we were ordering CT scan, that we would contact her once we got results and for her to keep her appointment next week with Dr. Elsworth Soho. She can call office with any questions. Nothing further needed at this time.

## 2020-05-15 ENCOUNTER — Ambulatory Visit (HOSPITAL_COMMUNITY)
Admission: RE | Admit: 2020-05-15 | Discharge: 2020-05-15 | Disposition: A | Discharge status: Home / Self Care | Payer: Medicare HMO | Source: Ambulatory Visit | Attending: Pulmonary Disease | Admitting: Pulmonary Disease

## 2020-05-15 ENCOUNTER — Other Ambulatory Visit: Payer: Self-pay

## 2020-05-15 DIAGNOSIS — I2510 Atherosclerotic heart disease of native coronary artery without angina pectoris: Secondary | ICD-10-CM | POA: Diagnosis not present

## 2020-05-15 DIAGNOSIS — R062 Wheezing: Secondary | ICD-10-CM | POA: Diagnosis not present

## 2020-05-15 DIAGNOSIS — J4530 Mild persistent asthma, uncomplicated: Secondary | ICD-10-CM | POA: Diagnosis not present

## 2020-05-15 DIAGNOSIS — I700 Atherosclerosis of aorta: Secondary | ICD-10-CM | POA: Diagnosis not present

## 2020-05-15 DIAGNOSIS — R918 Other nonspecific abnormal finding of lung field: Secondary | ICD-10-CM | POA: Diagnosis not present

## 2020-05-16 ENCOUNTER — Telehealth: Payer: Self-pay | Admitting: *Deleted

## 2020-05-16 ENCOUNTER — Encounter: Payer: Self-pay | Admitting: Pulmonary Disease

## 2020-05-16 ENCOUNTER — Telehealth: Payer: Self-pay | Admitting: Pulmonary Disease

## 2020-05-16 ENCOUNTER — Ambulatory Visit: Payer: Medicare HMO | Admitting: Pulmonary Disease

## 2020-05-16 VITALS — BP 130/78 | HR 62 | Temp 97.1°F | Ht 66.0 in | Wt 222.0 lb

## 2020-05-16 DIAGNOSIS — J4530 Mild persistent asthma, uncomplicated: Secondary | ICD-10-CM

## 2020-05-16 DIAGNOSIS — T17808D Unspecified foreign body in other parts of respiratory tract causing other injury, subsequent encounter: Secondary | ICD-10-CM

## 2020-05-16 NOTE — Telephone Encounter (Signed)
Called back [phone] and spoke to [facility] CT results were put into Epic and Dr. Elsworth Soho is aware and discussing with patient right now as she is in office for OV. Nothing further needed at this time.

## 2020-05-16 NOTE — H&P (View-Only) (Signed)
   Subjective:    Patient ID: Donna Gaines, female    DOB: Sep 13, 1951, 69 y.o.   MRN: [id number]  HPI  69 year old never smoker for FU of recurrent coughing and wheezing since Jan 2021 -Following episode of aspiration a piece of corn   Chief Complaint  Patient presents with  . Follow-up    Patient had CT done yesterday. Patient feels the same as last visit. Patient likes to take hot showers and has noticed recently that she has to back away from water or adjust it because she is having trouble breathing and when getting out of the shower she has a coughing fit with dry cough. Having problems with allergies. Patient feels like rescue inhaler made cough worse.   On her initial visit 03/2020, given albuterol MDI with a spacer-but she feels like this makes her cough worse. Reports seasonal allergies Not much improvement with PPI  Significant tests/ events reviewed CT chest 5/24 >> 1.1 cm foreign body noted in bronchus intermedius   Review of Systems Patient denies significant dyspnea,cough, hemoptysis,  chest pain, palpitations, pedal edema, orthopnea, paroxysmal nocturnal dyspnea, lightheadedness, nausea, vomiting, abdominal or  leg pains      Objective:   Physical Exam   Gen. Pleasant, well-nourished, in no distress ENT - no thrush, no pallor/icterus,no post nasal drip Neck: No JVD, no thyromegaly, no carotid bruits Lungs: no use of accessory muscles, no dullness to percussion, clear without rales or rhonchi  Cardiovascular: Rhythm regular, heart sounds  normal, no murmurs or gallops, no peripheral edema Musculoskeletal: No deformities, no cyanosis or clubbing         Assessment & Plan:

## 2020-05-16 NOTE — Patient Instructions (Signed)
Bronchoscopy next week at [facility]  We will call you with exact time & date

## 2020-05-16 NOTE — Telephone Encounter (Signed)
Called and spoke with patient. Let them know their Bronch is scheduled for May 24, 2020 with Dr. Elsworth Soho at [facility] at [phone].  Patient was instructed to arrive at hospital at 0830. They were instructed to bring someone with them as they will not be able to drive home from procedure. Patient instructed not to have anything to eat or drink after midnight.  Patient's covid screening is scheduled at [facility]  for 05/22/20 due to holiday on Monday site is closed  at 1140. Patient did ask for testing to be done at [facility] but [facility] stated they are not testing patients who are having procedures done in [place]. Let patient aware of this, they voiced understanding  Patient voiced understanding, nothing further needed  Routing to RA as FYI

## 2020-05-16 NOTE — Progress Notes (Signed)
   Subjective:    Patient ID: Donna Gaines, female    DOB: 03/13/1951, 69 y.o.   MRN: [id number]  HPI  69 year old never smoker for FU of recurrent coughing and wheezing since Jan 2021 -Following episode of aspiration a piece of corn   Chief Complaint  Patient presents with  . Follow-up    Patient had CT done yesterday. Patient feels the same as last visit. Patient likes to take hot showers and has noticed recently that she has to back away from water or adjust it because she is having trouble breathing and when getting out of the shower she has a coughing fit with dry cough. Having problems with allergies. Patient feels like rescue inhaler made cough worse.   On her initial visit 03/2020, given albuterol MDI with a spacer-but she feels like this makes her cough worse. Reports seasonal allergies Not much improvement with PPI  Significant tests/ events reviewed CT chest 5/24 >> 1.1 cm foreign body noted in bronchus intermedius   Review of Systems Patient denies significant dyspnea,cough, hemoptysis,  chest pain, palpitations, pedal edema, orthopnea, paroxysmal nocturnal dyspnea, lightheadedness, nausea, vomiting, abdominal or  leg pains      Objective:   Physical Exam   Gen. Pleasant, well-nourished, in no distress ENT - no thrush, no pallor/icterus,no post nasal drip Neck: No JVD, no thyromegaly, no carotid bruits Lungs: no use of accessory muscles, no dullness to percussion, clear without rales or rhonchi  Cardiovascular: Rhythm regular, heart sounds  normal, no murmurs or gallops, no peripheral edema Musculoskeletal: No deformities, no cyanosis or clubbing         Assessment & Plan:

## 2020-05-16 NOTE — Assessment & Plan Note (Signed)
CT chest reviewed which shows 1.1 cm foreign body in bronchus intermedius, favor foreign body likely aspirated piece of corn rather than carcinoid or other benign tumor We will plan for bronchoscopy under [facility] to facilitate removal of foreign body Risks and benefits of the procedure discussed with the patient and her husband in detail and they are willing to proceed

## 2020-05-16 NOTE — Telephone Encounter (Signed)
-----   Message from [facility], [place] sent at 05/16/2020  1:42 PM EDT ----- Regarding: Bronch Please schedule the following:   Diagnosis: foreign body aspiration  Procedure: bronchoscopy with FB removal/biopsy   Anesthesia: LMA  Do you need Fluro? NO  Priority: Y  Date: 6/2  Alternate Date: 6/3  Time: AM  Location: [facility]  Does patient have OSA? N DM? N Or Latex allergy? N  Medication Restriction: None  Anticoagulate/Antiplatelet: NA   Imaging request: NA  (If, SuperDimension CT Chest, please have STAT courier sent to [place].)   Please coordinate Pre-op COVID Testing

## 2020-05-20 DIAGNOSIS — E1165 Type 2 diabetes mellitus with hyperglycemia: Secondary | ICD-10-CM | POA: Diagnosis not present

## 2020-05-22 ENCOUNTER — Other Ambulatory Visit (HOSPITAL_COMMUNITY)
Admission: RE | Admit: 2020-05-22 | Discharge: 2020-05-22 | Disposition: A | Discharge status: Home / Self Care | Payer: Medicare HMO | Source: Ambulatory Visit | Attending: Pulmonary Disease | Admitting: Pulmonary Disease

## 2020-05-22 DIAGNOSIS — Z20822 Contact with and (suspected) exposure to covid-19: Secondary | ICD-10-CM | POA: Diagnosis not present

## 2020-05-22 DIAGNOSIS — Z01812 Encounter for preprocedural laboratory examination: Secondary | ICD-10-CM | POA: Insufficient documentation

## 2020-05-23 ENCOUNTER — Encounter (HOSPITAL_COMMUNITY): Payer: Self-pay | Admitting: Pulmonary Disease

## 2020-05-23 LAB — SARS CORONAVIRUS 2 (TAT 6-24 HRS): SARS Coronavirus 2: NEGATIVE

## 2020-05-24 ENCOUNTER — Ambulatory Visit (HOSPITAL_COMMUNITY): Payer: Medicare HMO | Admitting: Certified Registered Nurse Anesthetist

## 2020-05-24 ENCOUNTER — Encounter (HOSPITAL_COMMUNITY)
Admission: RE | Disposition: A | Discharge status: Home / Self Care | Payer: Self-pay | Source: Home / Self Care | Attending: Pulmonary Disease

## 2020-05-24 ENCOUNTER — Ambulatory Visit (HOSPITAL_COMMUNITY)
Admission: RE | Admit: 2020-05-24 | Discharge: 2020-05-24 | Disposition: A | Discharge status: Home / Self Care | Payer: Medicare HMO | Attending: Pulmonary Disease | Admitting: Pulmonary Disease

## 2020-05-24 ENCOUNTER — Other Ambulatory Visit: Payer: Self-pay

## 2020-05-24 ENCOUNTER — Encounter (HOSPITAL_COMMUNITY): Payer: Self-pay | Admitting: Pulmonary Disease

## 2020-05-24 DIAGNOSIS — X58XXXA Exposure to other specified factors, initial encounter: Secondary | ICD-10-CM | POA: Insufficient documentation

## 2020-05-24 DIAGNOSIS — E039 Hypothyroidism, unspecified: Secondary | ICD-10-CM | POA: Diagnosis not present

## 2020-05-24 DIAGNOSIS — T17828A Food in other parts of respiratory tract causing other injury, initial encounter: Secondary | ICD-10-CM | POA: Insufficient documentation

## 2020-05-24 DIAGNOSIS — T17508A Unspecified foreign body in bronchus causing other injury, initial encounter: Secondary | ICD-10-CM

## 2020-05-24 DIAGNOSIS — R05 Cough: Secondary | ICD-10-CM | POA: Diagnosis not present

## 2020-05-24 DIAGNOSIS — R062 Wheezing: Secondary | ICD-10-CM | POA: Diagnosis not present

## 2020-05-24 DIAGNOSIS — R69 Illness, unspecified: Secondary | ICD-10-CM | POA: Diagnosis not present

## 2020-05-24 DIAGNOSIS — Z86010 Personal history of colonic polyps: Secondary | ICD-10-CM | POA: Diagnosis not present

## 2020-05-24 DIAGNOSIS — T17508D Unspecified foreign body in bronchus causing other injury, subsequent encounter: Secondary | ICD-10-CM

## 2020-05-24 DIAGNOSIS — R590 Localized enlarged lymph nodes: Secondary | ICD-10-CM | POA: Diagnosis present

## 2020-05-24 DIAGNOSIS — J45909 Unspecified asthma, uncomplicated: Secondary | ICD-10-CM | POA: Diagnosis not present

## 2020-05-24 HISTORY — PX: VIDEO BRONCHOSCOPY: SHX5072

## 2020-05-24 HISTORY — PX: BRONCHIAL WASHINGS: SHX5105

## 2020-05-24 HISTORY — PX: BRONCHIAL BRUSHINGS: SHX5108

## 2020-05-24 HISTORY — PX: FOREIGN BODY REMOVAL: SHX962

## 2020-05-24 LAB — GLUCOSE, CAPILLARY: Glucose-Capillary: 123 mg/dL — ABNORMAL HIGH (ref 70–99)

## 2020-05-24 SURGERY — VIDEO BRONCHOSCOPY WITHOUT FLUORO
Anesthesia: General

## 2020-05-24 MED ORDER — PROPOFOL 10 MG/ML IV BOLUS
INTRAVENOUS | Status: AC
  Filled 2020-05-24: qty 40

## 2020-05-24 MED ORDER — PROPOFOL 500 MG/50ML IV EMUL
INTRAVENOUS | Status: DC | PRN
  Administered 2020-05-24: 150 mg via INTRAVENOUS

## 2020-05-24 MED ORDER — ONDANSETRON HCL 4 MG/2ML IJ SOLN
INTRAMUSCULAR | Status: DC | PRN
  Administered 2020-05-24: 4 mg via INTRAVENOUS

## 2020-05-24 MED ORDER — DEXAMETHASONE SODIUM PHOSPHATE 10 MG/ML IJ SOLN
INTRAMUSCULAR | Status: DC | PRN
  Administered 2020-05-24: 10 mg via INTRAVENOUS

## 2020-05-24 MED ORDER — PROPOFOL 500 MG/50ML IV EMUL
INTRAVENOUS | Status: AC
  Filled 2020-05-24: qty 50

## 2020-05-24 MED ORDER — LIDOCAINE HCL (CARDIAC) PF 100 MG/5ML IV SOSY
PREFILLED_SYRINGE | INTRAVENOUS | Status: DC | PRN
  Administered 2020-05-24: 80 mg via INTRAVENOUS

## 2020-05-24 MED ORDER — LACTATED RINGERS IV SOLN: INTRAVENOUS | Status: DC

## 2020-05-24 MED ORDER — MIDAZOLAM HCL 2 MG/2ML IJ SOLN
INTRAMUSCULAR | Status: AC
  Filled 2020-05-24: qty 2

## 2020-05-24 MED ORDER — SUCCINYLCHOLINE CHLORIDE 20 MG/ML IJ SOLN
INTRAMUSCULAR | Status: DC | PRN
  Administered 2020-05-24: 100 mg via INTRAVENOUS

## 2020-05-24 NOTE — Transfer of Care (Signed)
Immediate Anesthesia Transfer of Care Note  Patient: Donna Gaines  Procedure(s) Performed: Procedure(s) with comments: VIDEO BRONCHOSCOPY WITHOUT FLUORO (N/A) - LMA FOREIGN BODY REMOVAL  Patient Location: PACU  Anesthesia Type:General  Level of Consciousness:  sedated, patient cooperative and responds to stimulation  Airway & Oxygen Therapy:Patient Spontanous Breathing and Patient connected to face mask oxgen  Post-op Assessment:  Report given to PACU RN and Post -op Vital signs reviewed and stable  Post vital signs:  Reviewed and stable  Last Vitals:  Vitals:   05/24/20 0849  BP: (!) 167/96  Pulse: 77  Resp: 19  Temp: 36.7 C  SpO2: 0000000    Complications: No apparent anesthesia complications

## 2020-05-24 NOTE — Anesthesia Postprocedure Evaluation (Signed)
Anesthesia Post Note  Patient: ANGIE SHOAF  Procedure(s) Performed: VIDEO BRONCHOSCOPY WITHOUT FLUORO (N/A ) FOREIGN BODY REMOVAL BRONCHIAL WASHINGS BRONCHIAL BRUSHINGS     Patient location during evaluation: PACU Anesthesia Type: General Level of consciousness: awake and alert Pain management: pain level controlled Vital Signs Assessment: post-procedure vital signs reviewed and stable Respiratory status: spontaneous breathing, nonlabored ventilation, respiratory function stable and patient connected to nasal cannula oxygen Cardiovascular status: blood pressure returned to baseline and stable Postop Assessment: no apparent nausea or vomiting Anesthetic complications: no    Last Vitals:  Vitals:   05/24/20 1050 05/24/20 1100  BP: (!) 148/81 (!) 155/75  Pulse: 77 74  Resp: 17 16  Temp:    SpO2: 100% 92%    Last Pain:  Vitals:   05/24/20 1100  TempSrc:   PainSc: 0-No pain                 Jamy Whyte S

## 2020-05-24 NOTE — Anesthesia Procedure Notes (Signed)
Procedure Name: Intubation Date/Time: 05/24/2020 10:07 AM Performed by: Lavina Hamman, CRNA Pre-anesthesia Checklist: Patient identified, Emergency Drugs available, Suction available, Patient being monitored and Timeout performed Patient Re-evaluated:Patient Re-evaluated prior to induction Oxygen Delivery Method: Circle system utilized Preoxygenation: Pre-oxygenation with 100% oxygen Induction Type: IV induction Ventilation: Mask ventilation without difficulty Laryngoscope Size: Mac and 3 Grade View: Grade I Tube type: Oral Tube size: 9.0 mm Number of attempts: 1 Airway Equipment and Method: Stylet Placement Confirmation: ETT inserted through vocal cords under direct vision,  positive ETCO2,  CO2 detector and breath sounds checked- equal and bilateral Secured at: 20 cm Tube secured with: Tape Dental Injury: Teeth and Oropharynx as per pre-operative assessment  Comments: ATOI

## 2020-05-24 NOTE — Op Note (Signed)
  Name:  [name] MRN:  [id number] DOB:  [birth year]  PROCEDURE NOTE  Procedure(s): Flexible bronchoscopy [phone]) Brushing [phone]) of the RLL Bronchial alveolar lavage (12878) of the RLL Foreign body removal  Indications:  Hilar / mediastinal lymphadenopathy.  Consent:  Written informed consent was obtained prior to the procedure. The risks of the procedure including coughing, bleeding and the small chance of lung puncture requiring chest tube were discussed in great detail. The benefits & alternatives including serial follow up were also discussed.  Anesthesia:  General endotracheal.  Procedure summary:  Appropriate equipment was assembled.  The patient was  identified as Donna Gaines. Interim history obtained and brought to the endoscopy room. Safety timeout was performed. The patient was placed supine on the operating table, airway established and general anesthesia administered by Anesthesia team.   After the appropriate level of anesthesia was assured, flexible video bronchoscope was lubricated and inserted through the endotracheal tube.    Airway examination was performed bilaterally to subsegmental level.  Minimal clear secretions were noted, mucosa appeared normal . A piece of corn noted obstructing  bronchus intermedius & could not be traversed. After flushing with saline, middle lobe take off was seen. Dornia basket used & on 3rd attempt, foreign body was fished out & identified as a Freight forwarder .Inspection of RML & now open RLL bronchus was performed &no endobronchial lesions or other foreign bodies were identified.   Flexible video bronchoscope was used again to perform BAL & brushings from RLL where mucosa appeared irregular where foreign body was located.  After ensuring hemostasis , the bronchoscope was withdrawn.  The patient was extubated in operating room and transferred to PACU. Post-procedure chest x-ray was ordered.  Specimens sent: Bronchial  alveolar lavage specimen of the RLL for  microbiology and cytology. Brushings for cytology Foreign body given to patient  Complications:  No immediate complications were noted.  Hemodynamic parameters and oxygenation remained stable throughout the procedure.  Estimated blood loss:  Less then 5 mL.   Kara Mead MD. Shade Flood. [facility] Pager [phone] If no response call [phone]   05/24/2020 10:32 AM

## 2020-05-24 NOTE — Discharge Instructions (Signed)
Flexible Bronchoscopy, Care After This sheet gives you information about how to care for yourself after your test. Your doctor may also give you more specific instructions. If you have problems or questions, contact your doctor. Follow these instructions at home: Eating and drinking  Do not eat or drink anything (not even water) for 2 hours after your test, or until your numbing medicine (local anesthetic) wears off.  When your numbness is gone and your cough and gag reflexes have come back, you may: ? Eat only soft foods. ? Slowly drink liquids.  The day after the test, go back to your normal diet. Driving  Do not drive for 24 hours if you were given a medicine to help you relax (sedative).  Do not drive or use heavy machinery while taking prescription pain medicine. General instructions   Take over-the-counter and prescription medicines only as told by your doctor.  Return to your normal activities as told. Ask what activities are safe for you.  Do not use any products that have nicotine or tobacco in them. This includes cigarettes and e-cigarettes. If you need help quitting, ask your doctor.  Keep all follow-up visits as told by your doctor. This is important. It is very important if you had a tissue sample (biopsy) taken. Get help right away if:  You have shortness of breath that gets worse.  You get light-headed.  You feel like you are going to pass out (faint).  You have chest pain.  You cough up: ? More than a little blood. ? More blood than before. Summary  Do not eat or drink anything (not even water) for 2 hours after your test, or until your numbing medicine wears off.  Do not use cigarettes. Do not use e-cigarettes.  Get help right away if you have chest pain. This information is not intended to replace advice given to you by your health care provider. Make sure you discuss any questions you have with your health care provider. Document Revised: 11/20/2017  Document Reviewed: 12/26/2016 Elsevier Patient Education  2020 [employer].

## 2020-05-24 NOTE — Interval H&P Note (Signed)
History and Physical Interval Note:  05/24/2020 9:53 AM  Donna Gaines  has presented today for surgery, with the diagnosis of foreign body.  The various methods of treatment have been discussed with the patient and family. After consideration of risks, benefits and other options for treatment, the patient has consented to  Procedure(s) with comments: VIDEO BRONCHOSCOPY WITHOUT FLUORO (N/A) - LMA as a surgical intervention.  The patient's history has been reviewed, patient examined, no change in status, stable for surgery.  I have reviewed the patient's chart and labs.  Questions were answered to the patient's satisfaction.     Leanna Sato [place]sworth S[place]

## 2020-05-24 NOTE — Anesthesia Preprocedure Evaluation (Signed)
Anesthesia Evaluation  Patient identified by MRN, date of birth, ID band Patient awake    Reviewed: Allergy & Precautions, NPO status , Patient's Chart, lab work & pertinent test results  Airway Mallampati: II  TM Distance: >3 FB Neck ROM: Full    Dental no notable dental hx.    Pulmonary neg pulmonary ROS,    Pulmonary exam normal breath sounds clear to auscultation       Cardiovascular negative cardio ROS Normal cardiovascular exam Rhythm:Regular Rate:Normal     Neuro/Psych negative neurological ROS  negative psych ROS   GI/Hepatic negative GI ROS, Neg liver ROS,   Endo/Other  Hypothyroidism   Renal/GU negative Renal ROS  negative genitourinary   Musculoskeletal negative musculoskeletal ROS (+)   Abdominal   Peds negative pediatric ROS (+)  Hematology negative hematology ROS (+)   Anesthesia Other Findings   Reproductive/Obstetrics negative OB ROS                             Anesthesia Physical Anesthesia Plan  ASA: II  Anesthesia Plan: General   Post-op Pain Management:    Induction: Intravenous  PONV Risk Score and Plan: 3 and Ondansetron, Dexamethasone and Treatment may vary due to age or medical condition  Airway Management Planned: Oral ETT  Additional Equipment:   Intra-op Plan:   Post-operative Plan: Extubation in OR  Informed Consent: I have reviewed the patients History and Physical, chart, labs and discussed the procedure including the risks, benefits and alternatives for the proposed anesthesia with the patient or authorized representative who has indicated his/her understanding and acceptance.     Dental advisory given  Plan Discussed with: CRNA and Surgeon  Anesthesia Plan Comments:         Anesthesia Quick Evaluation  

## 2020-05-25 ENCOUNTER — Encounter: Payer: Self-pay | Admitting: *Deleted

## 2020-05-25 LAB — CYTOLOGY - NON PAP

## 2020-05-26 LAB — CULTURE, BAL-QUANTITATIVE W GRAM STAIN: Culture: 80000 — AB

## 2020-06-19 DIAGNOSIS — E1165 Type 2 diabetes mellitus with hyperglycemia: Secondary | ICD-10-CM | POA: Diagnosis not present

## 2020-07-02 ENCOUNTER — Ambulatory Visit: Payer: Medicare HMO | Admitting: Pulmonary Disease

## 2020-07-20 DIAGNOSIS — E1165 Type 2 diabetes mellitus with hyperglycemia: Secondary | ICD-10-CM | POA: Diagnosis not present

## 2020-08-08 DIAGNOSIS — E1165 Type 2 diabetes mellitus with hyperglycemia: Secondary | ICD-10-CM | POA: Diagnosis not present

## 2020-08-08 DIAGNOSIS — Z299 Encounter for prophylactic measures, unspecified: Secondary | ICD-10-CM | POA: Diagnosis not present

## 2020-08-08 DIAGNOSIS — E039 Hypothyroidism, unspecified: Secondary | ICD-10-CM | POA: Diagnosis not present

## 2020-08-08 DIAGNOSIS — Z6836 Body mass index (BMI) 36.0-36.9, adult: Secondary | ICD-10-CM | POA: Diagnosis not present

## 2020-08-13 ENCOUNTER — Other Ambulatory Visit (HOSPITAL_COMMUNITY): Payer: Self-pay | Admitting: Internal Medicine

## 2020-08-13 DIAGNOSIS — Z1231 Encounter for screening mammogram for malignant neoplasm of breast: Secondary | ICD-10-CM

## 2020-08-21 DIAGNOSIS — E1165 Type 2 diabetes mellitus with hyperglycemia: Secondary | ICD-10-CM | POA: Diagnosis not present

## 2020-09-20 DIAGNOSIS — E1165 Type 2 diabetes mellitus with hyperglycemia: Secondary | ICD-10-CM | POA: Diagnosis not present

## 2020-10-19 DIAGNOSIS — H2513 Age-related nuclear cataract, bilateral: Secondary | ICD-10-CM | POA: Diagnosis not present

## 2020-10-19 DIAGNOSIS — E119 Type 2 diabetes mellitus without complications: Secondary | ICD-10-CM | POA: Diagnosis not present

## 2020-10-19 DIAGNOSIS — H5203 Hypermetropia, bilateral: Secondary | ICD-10-CM | POA: Diagnosis not present

## 2020-10-20 DIAGNOSIS — E1165 Type 2 diabetes mellitus with hyperglycemia: Secondary | ICD-10-CM | POA: Diagnosis not present

## 2020-10-22 ENCOUNTER — Ambulatory Visit (HOSPITAL_COMMUNITY): Payer: Medicare HMO

## 2020-10-24 ENCOUNTER — Ambulatory Visit (HOSPITAL_COMMUNITY)
Admission: RE | Admit: 2020-10-24 | Discharge: 2020-10-24 | Disposition: A | Discharge status: Home / Self Care | Payer: Medicare HMO | Source: Ambulatory Visit | Attending: Internal Medicine | Admitting: Internal Medicine

## 2020-10-24 ENCOUNTER — Other Ambulatory Visit: Payer: Self-pay

## 2020-10-24 DIAGNOSIS — Z1231 Encounter for screening mammogram for malignant neoplasm of breast: Secondary | ICD-10-CM | POA: Insufficient documentation

## 2020-10-25 DIAGNOSIS — Z0100 Encounter for examination of eyes and vision without abnormal findings: Secondary | ICD-10-CM | POA: Diagnosis not present

## 2020-10-29 DIAGNOSIS — Z1339 Encounter for screening examination for other mental health and behavioral disorders: Secondary | ICD-10-CM | POA: Diagnosis not present

## 2020-10-29 DIAGNOSIS — Z7189 Other specified counseling: Secondary | ICD-10-CM | POA: Diagnosis not present

## 2020-10-29 DIAGNOSIS — Z01419 Encounter for gynecological examination (general) (routine) without abnormal findings: Secondary | ICD-10-CM | POA: Diagnosis not present

## 2020-10-29 DIAGNOSIS — Z299 Encounter for prophylactic measures, unspecified: Secondary | ICD-10-CM | POA: Diagnosis not present

## 2020-10-29 DIAGNOSIS — Z6836 Body mass index (BMI) 36.0-36.9, adult: Secondary | ICD-10-CM | POA: Diagnosis not present

## 2020-10-29 DIAGNOSIS — Z1331 Encounter for screening for depression: Secondary | ICD-10-CM | POA: Diagnosis not present

## 2020-10-29 DIAGNOSIS — E039 Hypothyroidism, unspecified: Secondary | ICD-10-CM | POA: Diagnosis not present

## 2020-10-29 DIAGNOSIS — Z0000 Encounter for general adult medical examination without abnormal findings: Secondary | ICD-10-CM | POA: Diagnosis not present

## 2020-10-29 DIAGNOSIS — E7800 Pure hypercholesterolemia, unspecified: Secondary | ICD-10-CM | POA: Diagnosis not present

## 2020-10-29 DIAGNOSIS — E1165 Type 2 diabetes mellitus with hyperglycemia: Secondary | ICD-10-CM | POA: Diagnosis not present

## 2020-11-13 DIAGNOSIS — S92355A Nondisplaced fracture of fifth metatarsal bone, left foot, initial encounter for closed fracture: Secondary | ICD-10-CM | POA: Diagnosis not present

## 2020-11-13 DIAGNOSIS — S93601A Unspecified sprain of right foot, initial encounter: Secondary | ICD-10-CM | POA: Diagnosis not present

## 2020-11-20 DIAGNOSIS — Z6836 Body mass index (BMI) 36.0-36.9, adult: Secondary | ICD-10-CM | POA: Diagnosis not present

## 2020-11-20 DIAGNOSIS — E1165 Type 2 diabetes mellitus with hyperglycemia: Secondary | ICD-10-CM | POA: Diagnosis not present

## 2020-11-20 DIAGNOSIS — E039 Hypothyroidism, unspecified: Secondary | ICD-10-CM | POA: Diagnosis not present

## 2020-11-20 DIAGNOSIS — Z299 Encounter for prophylactic measures, unspecified: Secondary | ICD-10-CM | POA: Diagnosis not present

## 2020-11-27 DIAGNOSIS — E2839 Other primary ovarian failure: Secondary | ICD-10-CM | POA: Diagnosis not present

## 2020-12-04 DIAGNOSIS — S92355D Nondisplaced fracture of fifth metatarsal bone, left foot, subsequent encounter for fracture with routine healing: Secondary | ICD-10-CM | POA: Diagnosis not present

## 2020-12-20 DIAGNOSIS — E1165 Type 2 diabetes mellitus with hyperglycemia: Secondary | ICD-10-CM | POA: Diagnosis not present

## 2021-01-21 DIAGNOSIS — E1165 Type 2 diabetes mellitus with hyperglycemia: Secondary | ICD-10-CM | POA: Diagnosis not present

## 2021-02-18 DIAGNOSIS — E1165 Type 2 diabetes mellitus with hyperglycemia: Secondary | ICD-10-CM | POA: Diagnosis not present

## 2021-02-26 DIAGNOSIS — E039 Hypothyroidism, unspecified: Secondary | ICD-10-CM | POA: Diagnosis not present

## 2021-02-26 DIAGNOSIS — Z299 Encounter for prophylactic measures, unspecified: Secondary | ICD-10-CM | POA: Diagnosis not present

## 2021-02-26 DIAGNOSIS — E7800 Pure hypercholesterolemia, unspecified: Secondary | ICD-10-CM | POA: Diagnosis not present

## 2021-02-26 DIAGNOSIS — E1165 Type 2 diabetes mellitus with hyperglycemia: Secondary | ICD-10-CM | POA: Diagnosis not present

## 2021-03-21 DIAGNOSIS — E1165 Type 2 diabetes mellitus with hyperglycemia: Secondary | ICD-10-CM | POA: Diagnosis not present

## 2021-04-19 DIAGNOSIS — E1165 Type 2 diabetes mellitus with hyperglycemia: Secondary | ICD-10-CM | POA: Diagnosis not present

## 2021-05-21 DIAGNOSIS — E1165 Type 2 diabetes mellitus with hyperglycemia: Secondary | ICD-10-CM | POA: Diagnosis not present

## 2021-06-12 DIAGNOSIS — Z0100 Encounter for examination of eyes and vision without abnormal findings: Secondary | ICD-10-CM | POA: Diagnosis not present

## 2021-06-13 DIAGNOSIS — E039 Hypothyroidism, unspecified: Secondary | ICD-10-CM | POA: Diagnosis not present

## 2021-06-13 DIAGNOSIS — E1165 Type 2 diabetes mellitus with hyperglycemia: Secondary | ICD-10-CM | POA: Diagnosis not present

## 2021-06-13 DIAGNOSIS — Z299 Encounter for prophylactic measures, unspecified: Secondary | ICD-10-CM | POA: Diagnosis not present

## 2021-06-20 DIAGNOSIS — E1165 Type 2 diabetes mellitus with hyperglycemia: Secondary | ICD-10-CM | POA: Diagnosis not present

## 2021-07-19 DIAGNOSIS — E1165 Type 2 diabetes mellitus with hyperglycemia: Secondary | ICD-10-CM | POA: Diagnosis not present

## 2021-07-21 DIAGNOSIS — E1165 Type 2 diabetes mellitus with hyperglycemia: Secondary | ICD-10-CM | POA: Diagnosis not present

## 2021-08-21 DIAGNOSIS — E1165 Type 2 diabetes mellitus with hyperglycemia: Secondary | ICD-10-CM | POA: Diagnosis not present

## 2021-09-17 DIAGNOSIS — E1165 Type 2 diabetes mellitus with hyperglycemia: Secondary | ICD-10-CM | POA: Diagnosis not present

## 2021-09-17 DIAGNOSIS — Z299 Encounter for prophylactic measures, unspecified: Secondary | ICD-10-CM | POA: Diagnosis not present

## 2021-09-17 DIAGNOSIS — Z2821 Immunization not carried out because of patient refusal: Secondary | ICD-10-CM | POA: Diagnosis not present

## 2021-09-17 DIAGNOSIS — D692 Other nonthrombocytopenic purpura: Secondary | ICD-10-CM | POA: Diagnosis not present

## 2021-09-17 DIAGNOSIS — R42 Dizziness and giddiness: Secondary | ICD-10-CM | POA: Diagnosis not present

## 2021-09-19 ENCOUNTER — Other Ambulatory Visit (HOSPITAL_COMMUNITY): Payer: Self-pay | Admitting: Internal Medicine

## 2021-09-19 DIAGNOSIS — Z1231 Encounter for screening mammogram for malignant neoplasm of breast: Secondary | ICD-10-CM

## 2021-09-20 DIAGNOSIS — E1165 Type 2 diabetes mellitus with hyperglycemia: Secondary | ICD-10-CM | POA: Diagnosis not present

## 2021-10-21 DIAGNOSIS — E1165 Type 2 diabetes mellitus with hyperglycemia: Secondary | ICD-10-CM | POA: Diagnosis not present

## 2021-10-28 ENCOUNTER — Other Ambulatory Visit: Payer: Self-pay

## 2021-10-28 ENCOUNTER — Ambulatory Visit (HOSPITAL_COMMUNITY)
Admission: RE | Admit: 2021-10-28 | Discharge: 2021-10-28 | Disposition: A | Discharge status: Home / Self Care | Payer: Medicare HMO | Source: Ambulatory Visit | Attending: Internal Medicine | Admitting: Internal Medicine

## 2021-10-28 DIAGNOSIS — Z1231 Encounter for screening mammogram for malignant neoplasm of breast: Secondary | ICD-10-CM | POA: Diagnosis not present

## 2021-10-31 DIAGNOSIS — Z7189 Other specified counseling: Secondary | ICD-10-CM | POA: Diagnosis not present

## 2021-10-31 DIAGNOSIS — Z1331 Encounter for screening for depression: Secondary | ICD-10-CM | POA: Diagnosis not present

## 2021-10-31 DIAGNOSIS — Z0000 Encounter for general adult medical examination without abnormal findings: Secondary | ICD-10-CM | POA: Diagnosis not present

## 2021-10-31 DIAGNOSIS — E039 Hypothyroidism, unspecified: Secondary | ICD-10-CM | POA: Diagnosis not present

## 2021-10-31 DIAGNOSIS — Z2821 Immunization not carried out because of patient refusal: Secondary | ICD-10-CM | POA: Diagnosis not present

## 2021-10-31 DIAGNOSIS — Z79899 Other long term (current) drug therapy: Secondary | ICD-10-CM | POA: Diagnosis not present

## 2021-10-31 DIAGNOSIS — R5383 Other fatigue: Secondary | ICD-10-CM | POA: Diagnosis not present

## 2021-10-31 DIAGNOSIS — Z6838 Body mass index (BMI) 38.0-38.9, adult: Secondary | ICD-10-CM | POA: Diagnosis not present

## 2021-10-31 DIAGNOSIS — Z299 Encounter for prophylactic measures, unspecified: Secondary | ICD-10-CM | POA: Diagnosis not present

## 2021-10-31 DIAGNOSIS — Z1339 Encounter for screening examination for other mental health and behavioral disorders: Secondary | ICD-10-CM | POA: Diagnosis not present

## 2021-10-31 DIAGNOSIS — E7800 Pure hypercholesterolemia, unspecified: Secondary | ICD-10-CM | POA: Diagnosis not present

## 2021-11-20 DIAGNOSIS — E1165 Type 2 diabetes mellitus with hyperglycemia: Secondary | ICD-10-CM | POA: Diagnosis not present

## 2021-12-20 DIAGNOSIS — E1165 Type 2 diabetes mellitus with hyperglycemia: Secondary | ICD-10-CM | POA: Diagnosis not present

## 2022-01-02 DIAGNOSIS — I700 Atherosclerosis of aorta: Secondary | ICD-10-CM | POA: Diagnosis not present

## 2022-01-02 DIAGNOSIS — E1165 Type 2 diabetes mellitus with hyperglycemia: Secondary | ICD-10-CM | POA: Diagnosis not present

## 2022-01-02 DIAGNOSIS — Z299 Encounter for prophylactic measures, unspecified: Secondary | ICD-10-CM | POA: Diagnosis not present

## 2022-01-02 DIAGNOSIS — N390 Urinary tract infection, site not specified: Secondary | ICD-10-CM | POA: Diagnosis not present

## 2022-01-02 DIAGNOSIS — E039 Hypothyroidism, unspecified: Secondary | ICD-10-CM | POA: Diagnosis not present

## 2022-01-02 DIAGNOSIS — R350 Frequency of micturition: Secondary | ICD-10-CM | POA: Diagnosis not present

## 2022-01-02 DIAGNOSIS — Z789 Other specified health status: Secondary | ICD-10-CM | POA: Diagnosis not present

## 2022-01-19 DIAGNOSIS — E1165 Type 2 diabetes mellitus with hyperglycemia: Secondary | ICD-10-CM | POA: Diagnosis not present

## 2022-02-18 DIAGNOSIS — E1165 Type 2 diabetes mellitus with hyperglycemia: Secondary | ICD-10-CM | POA: Diagnosis not present

## 2022-03-17 DIAGNOSIS — K5900 Constipation, unspecified: Secondary | ICD-10-CM | POA: Diagnosis not present

## 2022-03-17 DIAGNOSIS — M545 Low back pain, unspecified: Secondary | ICD-10-CM | POA: Diagnosis not present

## 2022-03-17 DIAGNOSIS — R3989 Other symptoms and signs involving the genitourinary system: Secondary | ICD-10-CM | POA: Diagnosis not present

## 2022-03-17 DIAGNOSIS — N819 Female genital prolapse, unspecified: Secondary | ICD-10-CM | POA: Diagnosis not present

## 2022-03-20 DIAGNOSIS — E1165 Type 2 diabetes mellitus with hyperglycemia: Secondary | ICD-10-CM | POA: Diagnosis not present

## 2022-03-20 IMAGING — CT CT CHEST W/O CM
2 of 4 series · 15 of 36 positions shown, 18 images · non-contrast
Comparison: None.

CLINICAL DATA: Wheezing. Rule out aspirated foreign body.

EXAM:
CT CHEST WITHOUT CONTRAST
TECHNIQUE: Multidetector CT imaging of the chest was performed following the
standard protocol without IV contrast.

[Series 2: routine chest without · axial · non-contrast · 0.69mm/px · z∈[+1088,+1338]mm · 12 of 149 slices shown, 15 images]
[im 12/149  mediastinal]
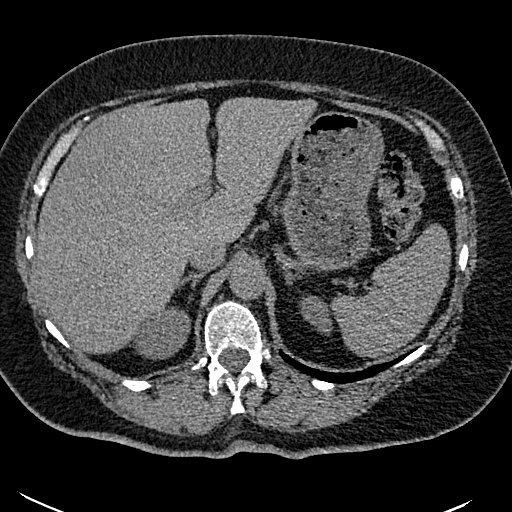
[im 12/149  lung]
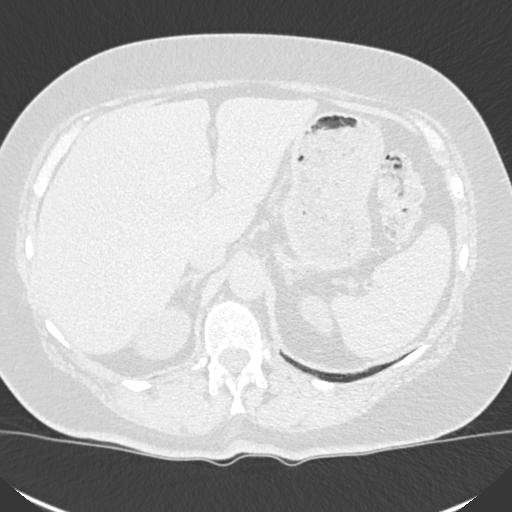
[im 23/149  lung]
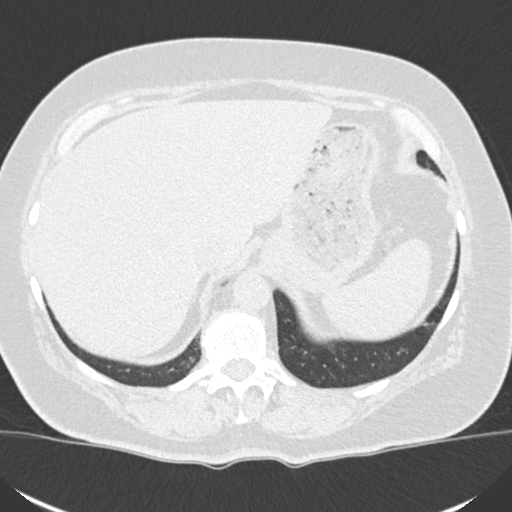
[im 35/149  lung]
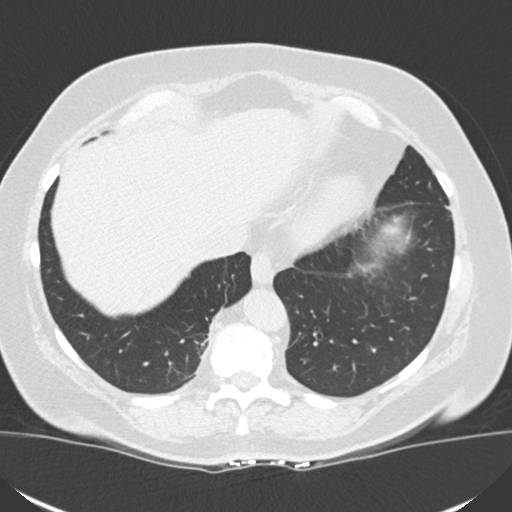
[im 46/149  lung]
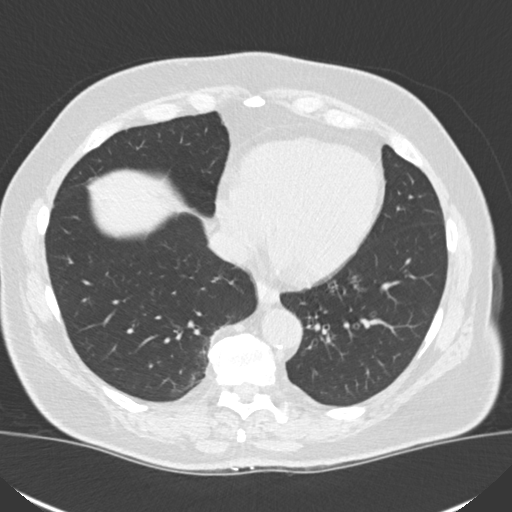
[im 57/149  mediastinal]
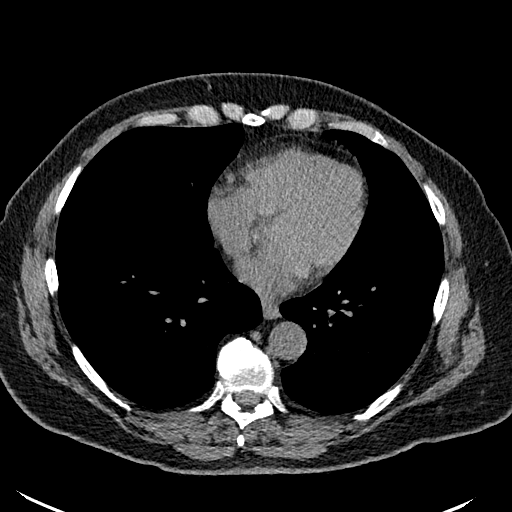
[im 57/149  lung]
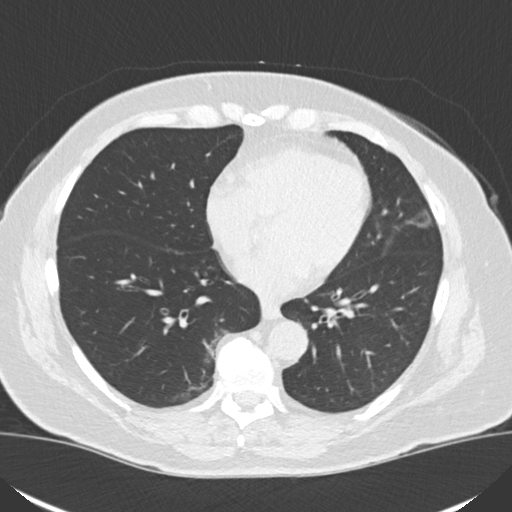
[im 69/149  lung]
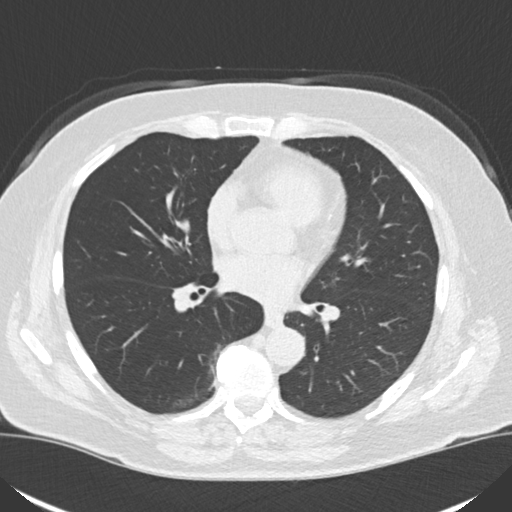
[im 80/149  lung]
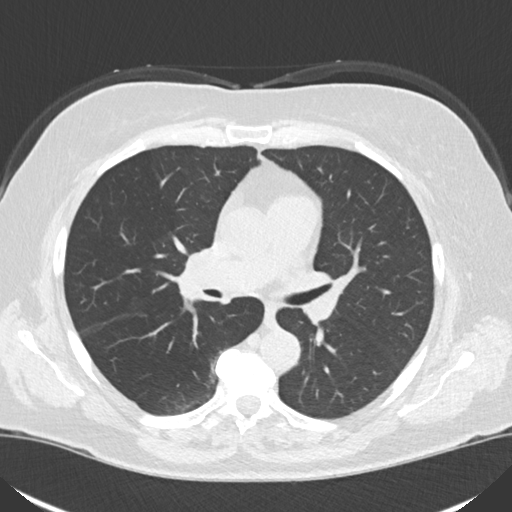
[im 92/149  lung]
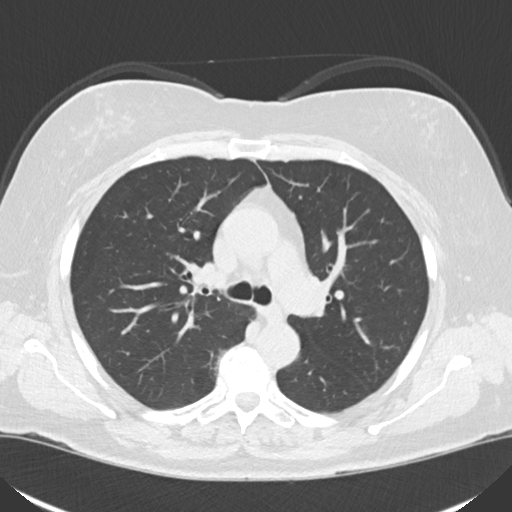
[im 103/149  mediastinal]
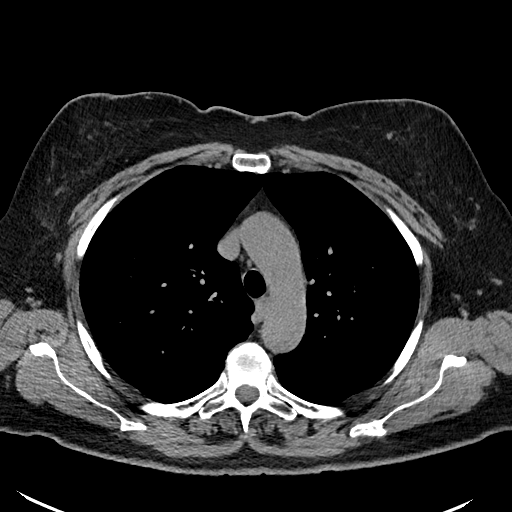
[im 103/149  lung]
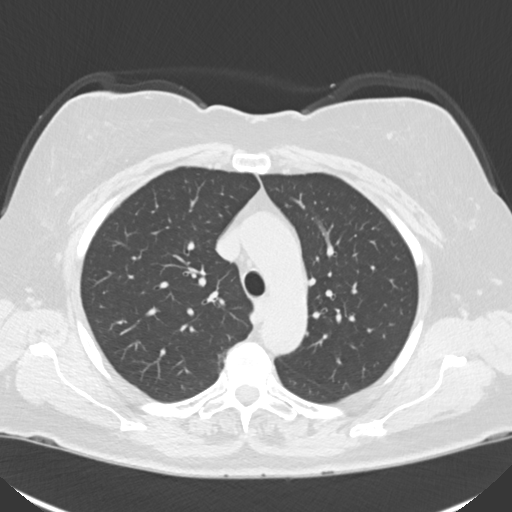
[im 114/149  lung]
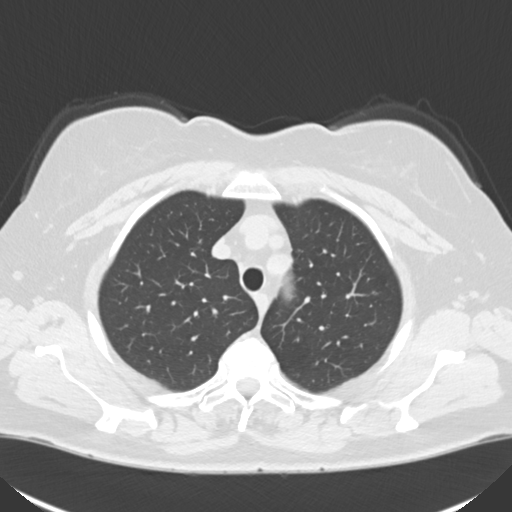
[im 126/149  lung]
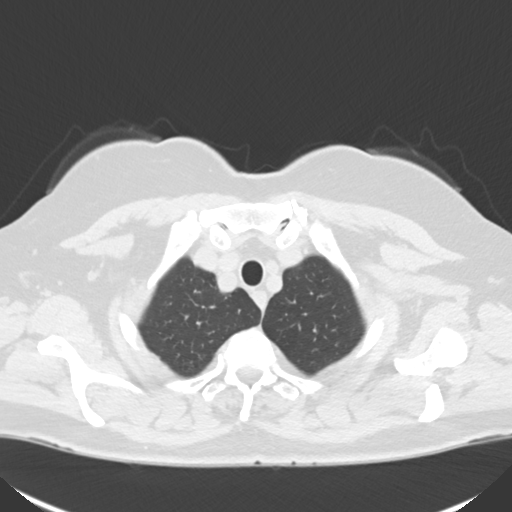
[im 137/149  lung]
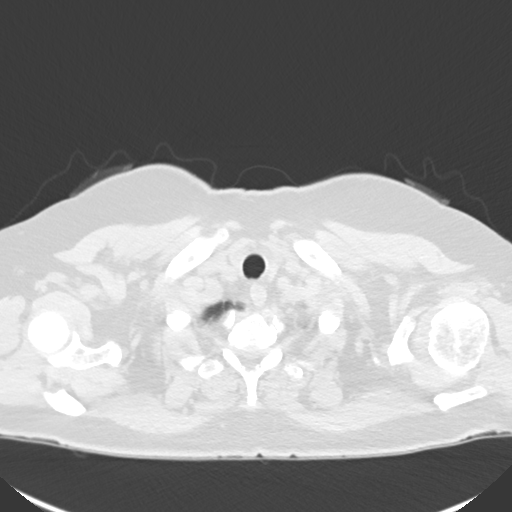

[Series 5: coronal · coronal · 0.62mm/px · 3 of 132 slices shown]
[im 27/132  lung]
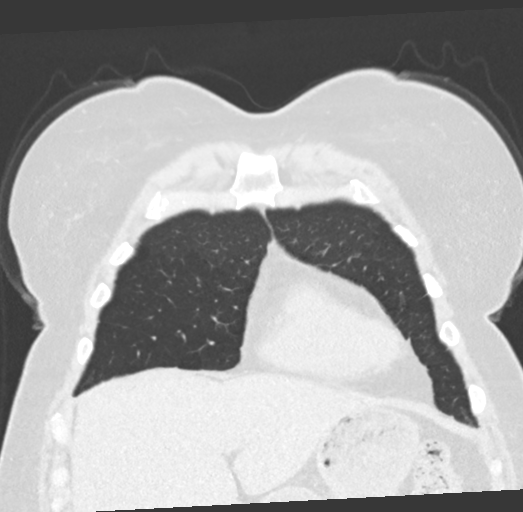
[im 53/132  lung]
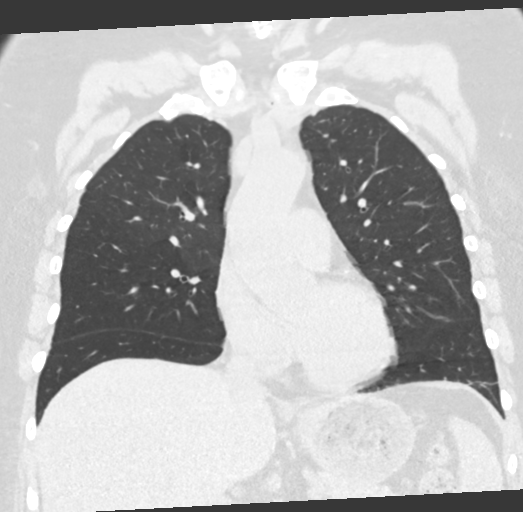
[im 79/132  lung]
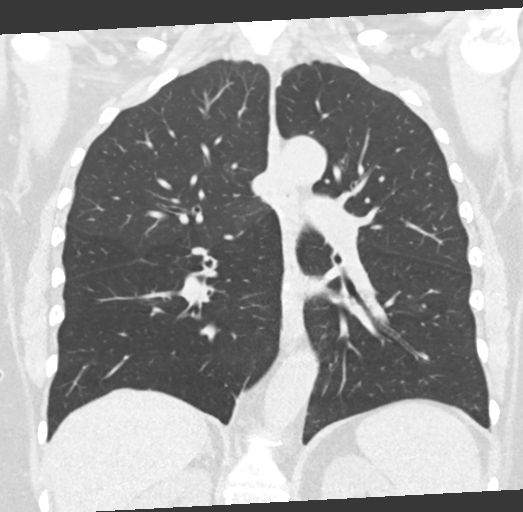

[15 of 36 positions shown; findings below may reference images not displayed]

FINDINGS: Cardiovascular: The heart size appears within normal limits. No
pericardial effusion identified. Mild aortic atherosclerosis. Lad
coronary artery calcifications.

Mediastinum/Nodes: Normal appearance of the thyroid gland. The
trachea appears patent and is midline. Filling defect is identified
within the bronchus intermedius measuring 1.1 x 0.6 cm, image 74/5.
Normal appearance of the esophagus. No enlarged mediastinal or hilar
lymph nodes.

Lungs/Pleura: No pleural effusion, airspace consolidation or
atelectasis identified. No suspicious pulmonary nodule or mass
identified.

Upper Abdomen: No acute abnormality.

Musculoskeletal: Degenerative disc disease noted within the thoracic
spine.
IMPRESSION: 1. There is a 1.1 cm filling defect within the bronchus intermedius
compatible with suspected aspirated foreign body. No atelectasis
identified.
2. Aortic atherosclerosis. Coronary artery calcifications noted.

Aortic Atherosclerosis ([id number]).

## 2022-04-08 DIAGNOSIS — E1165 Type 2 diabetes mellitus with hyperglycemia: Secondary | ICD-10-CM | POA: Diagnosis not present

## 2022-04-08 DIAGNOSIS — E039 Hypothyroidism, unspecified: Secondary | ICD-10-CM | POA: Diagnosis not present

## 2022-04-08 DIAGNOSIS — Z299 Encounter for prophylactic measures, unspecified: Secondary | ICD-10-CM | POA: Diagnosis not present

## 2022-04-08 DIAGNOSIS — Z789 Other specified health status: Secondary | ICD-10-CM | POA: Diagnosis not present

## 2022-04-20 DIAGNOSIS — E1165 Type 2 diabetes mellitus with hyperglycemia: Secondary | ICD-10-CM | POA: Diagnosis not present

## 2022-05-12 DIAGNOSIS — M546 Pain in thoracic spine: Secondary | ICD-10-CM | POA: Diagnosis not present

## 2022-05-12 DIAGNOSIS — M9901 Segmental and somatic dysfunction of cervical region: Secondary | ICD-10-CM | POA: Diagnosis not present

## 2022-05-12 DIAGNOSIS — M542 Cervicalgia: Secondary | ICD-10-CM | POA: Diagnosis not present

## 2022-05-12 DIAGNOSIS — M9903 Segmental and somatic dysfunction of lumbar region: Secondary | ICD-10-CM | POA: Diagnosis not present

## 2022-05-12 DIAGNOSIS — M26602 Left temporomandibular joint disorder, unspecified: Secondary | ICD-10-CM | POA: Diagnosis not present

## 2022-05-12 DIAGNOSIS — M9902 Segmental and somatic dysfunction of thoracic region: Secondary | ICD-10-CM | POA: Diagnosis not present

## 2022-05-16 DIAGNOSIS — M26602 Left temporomandibular joint disorder, unspecified: Secondary | ICD-10-CM | POA: Diagnosis not present

## 2022-05-16 DIAGNOSIS — M9903 Segmental and somatic dysfunction of lumbar region: Secondary | ICD-10-CM | POA: Diagnosis not present

## 2022-05-16 DIAGNOSIS — M9902 Segmental and somatic dysfunction of thoracic region: Secondary | ICD-10-CM | POA: Diagnosis not present

## 2022-05-16 DIAGNOSIS — M546 Pain in thoracic spine: Secondary | ICD-10-CM | POA: Diagnosis not present

## 2022-05-16 DIAGNOSIS — M9901 Segmental and somatic dysfunction of cervical region: Secondary | ICD-10-CM | POA: Diagnosis not present

## 2022-05-16 DIAGNOSIS — M542 Cervicalgia: Secondary | ICD-10-CM | POA: Diagnosis not present

## 2022-05-20 DIAGNOSIS — M542 Cervicalgia: Secondary | ICD-10-CM | POA: Diagnosis not present

## 2022-05-20 DIAGNOSIS — M26602 Left temporomandibular joint disorder, unspecified: Secondary | ICD-10-CM | POA: Diagnosis not present

## 2022-05-20 DIAGNOSIS — M546 Pain in thoracic spine: Secondary | ICD-10-CM | POA: Diagnosis not present

## 2022-05-20 DIAGNOSIS — E1165 Type 2 diabetes mellitus with hyperglycemia: Secondary | ICD-10-CM | POA: Diagnosis not present

## 2022-05-20 DIAGNOSIS — M9902 Segmental and somatic dysfunction of thoracic region: Secondary | ICD-10-CM | POA: Diagnosis not present

## 2022-05-20 DIAGNOSIS — M9901 Segmental and somatic dysfunction of cervical region: Secondary | ICD-10-CM | POA: Diagnosis not present

## 2022-05-20 DIAGNOSIS — M9903 Segmental and somatic dysfunction of lumbar region: Secondary | ICD-10-CM | POA: Diagnosis not present

## 2022-05-28 DIAGNOSIS — M9901 Segmental and somatic dysfunction of cervical region: Secondary | ICD-10-CM | POA: Diagnosis not present

## 2022-05-28 DIAGNOSIS — M542 Cervicalgia: Secondary | ICD-10-CM | POA: Diagnosis not present

## 2022-05-28 DIAGNOSIS — M9902 Segmental and somatic dysfunction of thoracic region: Secondary | ICD-10-CM | POA: Diagnosis not present

## 2022-05-28 DIAGNOSIS — M26602 Left temporomandibular joint disorder, unspecified: Secondary | ICD-10-CM | POA: Diagnosis not present

## 2022-05-28 DIAGNOSIS — M546 Pain in thoracic spine: Secondary | ICD-10-CM | POA: Diagnosis not present

## 2022-05-28 DIAGNOSIS — M9903 Segmental and somatic dysfunction of lumbar region: Secondary | ICD-10-CM | POA: Diagnosis not present

## 2022-06-19 DIAGNOSIS — E1165 Type 2 diabetes mellitus with hyperglycemia: Secondary | ICD-10-CM | POA: Diagnosis not present

## 2022-07-16 DIAGNOSIS — Z299 Encounter for prophylactic measures, unspecified: Secondary | ICD-10-CM | POA: Diagnosis not present

## 2022-07-16 DIAGNOSIS — I700 Atherosclerosis of aorta: Secondary | ICD-10-CM | POA: Diagnosis not present

## 2022-07-16 DIAGNOSIS — E1151 Type 2 diabetes mellitus with diabetic peripheral angiopathy without gangrene: Secondary | ICD-10-CM | POA: Diagnosis not present

## 2022-07-16 DIAGNOSIS — Z789 Other specified health status: Secondary | ICD-10-CM | POA: Diagnosis not present

## 2022-07-18 DIAGNOSIS — H524 Presbyopia: Secondary | ICD-10-CM | POA: Diagnosis not present

## 2022-07-21 DIAGNOSIS — E1165 Type 2 diabetes mellitus with hyperglycemia: Secondary | ICD-10-CM | POA: Diagnosis not present

## 2022-08-20 DIAGNOSIS — E1165 Type 2 diabetes mellitus with hyperglycemia: Secondary | ICD-10-CM | POA: Diagnosis not present

## 2022-09-18 ENCOUNTER — Other Ambulatory Visit (HOSPITAL_COMMUNITY): Payer: Self-pay | Admitting: Internal Medicine

## 2022-09-18 DIAGNOSIS — Z1231 Encounter for screening mammogram for malignant neoplasm of breast: Secondary | ICD-10-CM

## 2022-09-19 DIAGNOSIS — E1165 Type 2 diabetes mellitus with hyperglycemia: Secondary | ICD-10-CM | POA: Diagnosis not present

## 2022-10-20 DIAGNOSIS — E1165 Type 2 diabetes mellitus with hyperglycemia: Secondary | ICD-10-CM | POA: Diagnosis not present

## 2022-10-30 ENCOUNTER — Ambulatory Visit (HOSPITAL_COMMUNITY)
Admission: RE | Admit: 2022-10-30 | Discharge: 2022-10-30 | Disposition: A | Discharge status: Home / Self Care | Payer: Medicare HMO | Source: Ambulatory Visit | Attending: Internal Medicine | Admitting: Internal Medicine

## 2022-10-30 DIAGNOSIS — Z1231 Encounter for screening mammogram for malignant neoplasm of breast: Secondary | ICD-10-CM | POA: Diagnosis not present

## 2022-11-06 DIAGNOSIS — Z299 Encounter for prophylactic measures, unspecified: Secondary | ICD-10-CM | POA: Diagnosis not present

## 2022-11-06 DIAGNOSIS — E039 Hypothyroidism, unspecified: Secondary | ICD-10-CM | POA: Diagnosis not present

## 2022-11-06 DIAGNOSIS — E7800 Pure hypercholesterolemia, unspecified: Secondary | ICD-10-CM | POA: Diagnosis not present

## 2022-11-06 DIAGNOSIS — Z0000 Encounter for general adult medical examination without abnormal findings: Secondary | ICD-10-CM | POA: Diagnosis not present

## 2022-11-06 DIAGNOSIS — Z789 Other specified health status: Secondary | ICD-10-CM | POA: Diagnosis not present

## 2022-11-06 DIAGNOSIS — Z7189 Other specified counseling: Secondary | ICD-10-CM | POA: Diagnosis not present

## 2022-11-06 DIAGNOSIS — Z6838 Body mass index (BMI) 38.0-38.9, adult: Secondary | ICD-10-CM | POA: Diagnosis not present

## 2022-11-06 DIAGNOSIS — Z1331 Encounter for screening for depression: Secondary | ICD-10-CM | POA: Diagnosis not present

## 2022-11-06 DIAGNOSIS — Z79899 Other long term (current) drug therapy: Secondary | ICD-10-CM | POA: Diagnosis not present

## 2022-11-06 DIAGNOSIS — E1165 Type 2 diabetes mellitus with hyperglycemia: Secondary | ICD-10-CM | POA: Diagnosis not present

## 2022-11-06 DIAGNOSIS — Z1339 Encounter for screening examination for other mental health and behavioral disorders: Secondary | ICD-10-CM | POA: Diagnosis not present

## 2022-11-19 DIAGNOSIS — E1165 Type 2 diabetes mellitus with hyperglycemia: Secondary | ICD-10-CM | POA: Diagnosis not present

## 2022-12-19 DIAGNOSIS — E1165 Type 2 diabetes mellitus with hyperglycemia: Secondary | ICD-10-CM | POA: Diagnosis not present

## 2023-01-19 DIAGNOSIS — E1165 Type 2 diabetes mellitus with hyperglycemia: Secondary | ICD-10-CM | POA: Diagnosis not present

## 2023-01-27 DIAGNOSIS — E2839 Other primary ovarian failure: Secondary | ICD-10-CM | POA: Diagnosis not present

## 2023-02-10 DIAGNOSIS — Z299 Encounter for prophylactic measures, unspecified: Secondary | ICD-10-CM | POA: Diagnosis not present

## 2023-02-10 DIAGNOSIS — K649 Unspecified hemorrhoids: Secondary | ICD-10-CM | POA: Diagnosis not present

## 2023-02-10 DIAGNOSIS — K5909 Other constipation: Secondary | ICD-10-CM | POA: Diagnosis not present

## 2023-02-10 DIAGNOSIS — R109 Unspecified abdominal pain: Secondary | ICD-10-CM | POA: Diagnosis not present

## 2023-02-10 DIAGNOSIS — E1165 Type 2 diabetes mellitus with hyperglycemia: Secondary | ICD-10-CM | POA: Diagnosis not present

## 2023-02-11 ENCOUNTER — Encounter (INDEPENDENT_AMBULATORY_CARE_PROVIDER_SITE_OTHER): Payer: Self-pay | Admitting: *Deleted

## 2023-02-18 DIAGNOSIS — E1165 Type 2 diabetes mellitus with hyperglycemia: Secondary | ICD-10-CM | POA: Diagnosis not present

## 2023-03-13 DIAGNOSIS — Z124 Encounter for screening for malignant neoplasm of cervix: Secondary | ICD-10-CM | POA: Diagnosis not present

## 2023-03-13 DIAGNOSIS — Z01419 Encounter for gynecological examination (general) (routine) without abnormal findings: Secondary | ICD-10-CM | POA: Diagnosis not present

## 2023-03-13 DIAGNOSIS — L989 Disorder of the skin and subcutaneous tissue, unspecified: Secondary | ICD-10-CM | POA: Diagnosis not present

## 2023-03-21 DIAGNOSIS — E1165 Type 2 diabetes mellitus with hyperglycemia: Secondary | ICD-10-CM | POA: Diagnosis not present

## 2023-04-16 DIAGNOSIS — L820 Inflamed seborrheic keratosis: Secondary | ICD-10-CM | POA: Diagnosis not present

## 2023-04-16 DIAGNOSIS — D225 Melanocytic nevi of trunk: Secondary | ICD-10-CM | POA: Diagnosis not present

## 2023-04-16 DIAGNOSIS — Z1283 Encounter for screening for malignant neoplasm of skin: Secondary | ICD-10-CM | POA: Diagnosis not present

## 2023-04-20 DIAGNOSIS — E669 Obesity, unspecified: Secondary | ICD-10-CM | POA: Diagnosis not present

## 2023-04-20 DIAGNOSIS — R030 Elevated blood-pressure reading, without diagnosis of hypertension: Secondary | ICD-10-CM | POA: Diagnosis not present

## 2023-04-20 DIAGNOSIS — S0033XA Contusion of nose, initial encounter: Secondary | ICD-10-CM | POA: Diagnosis not present

## 2023-04-20 DIAGNOSIS — W1839XA Other fall on same level, initial encounter: Secondary | ICD-10-CM | POA: Diagnosis not present

## 2023-04-20 DIAGNOSIS — Z6839 Body mass index (BMI) 39.0-39.9, adult: Secondary | ICD-10-CM | POA: Diagnosis not present

## 2023-04-21 ENCOUNTER — Encounter (INDEPENDENT_AMBULATORY_CARE_PROVIDER_SITE_OTHER): Payer: Self-pay

## 2023-04-21 ENCOUNTER — Ambulatory Visit (INDEPENDENT_AMBULATORY_CARE_PROVIDER_SITE_OTHER): Payer: Medicare HMO | Admitting: Gastroenterology

## 2023-04-21 ENCOUNTER — Encounter (INDEPENDENT_AMBULATORY_CARE_PROVIDER_SITE_OTHER): Payer: Self-pay | Admitting: *Deleted

## 2023-04-21 DIAGNOSIS — E1165 Type 2 diabetes mellitus with hyperglycemia: Secondary | ICD-10-CM | POA: Diagnosis not present

## 2023-05-22 DIAGNOSIS — E1165 Type 2 diabetes mellitus with hyperglycemia: Secondary | ICD-10-CM | POA: Diagnosis not present

## 2023-06-09 DIAGNOSIS — R69 Illness, unspecified: Secondary | ICD-10-CM | POA: Diagnosis not present

## 2023-06-17 DIAGNOSIS — Z299 Encounter for prophylactic measures, unspecified: Secondary | ICD-10-CM | POA: Diagnosis not present

## 2023-06-17 DIAGNOSIS — E7800 Pure hypercholesterolemia, unspecified: Secondary | ICD-10-CM | POA: Diagnosis not present

## 2023-06-17 DIAGNOSIS — E1151 Type 2 diabetes mellitus with diabetic peripheral angiopathy without gangrene: Secondary | ICD-10-CM | POA: Diagnosis not present

## 2023-06-17 DIAGNOSIS — Z0000 Encounter for general adult medical examination without abnormal findings: Secondary | ICD-10-CM | POA: Diagnosis not present

## 2023-06-17 DIAGNOSIS — Z1331 Encounter for screening for depression: Secondary | ICD-10-CM | POA: Diagnosis not present

## 2023-06-17 DIAGNOSIS — Z79899 Other long term (current) drug therapy: Secondary | ICD-10-CM | POA: Diagnosis not present

## 2023-06-17 DIAGNOSIS — E039 Hypothyroidism, unspecified: Secondary | ICD-10-CM | POA: Diagnosis not present

## 2023-06-17 DIAGNOSIS — I700 Atherosclerosis of aorta: Secondary | ICD-10-CM | POA: Diagnosis not present

## 2023-06-17 DIAGNOSIS — Z7189 Other specified counseling: Secondary | ICD-10-CM | POA: Diagnosis not present

## 2023-06-17 DIAGNOSIS — Z1339 Encounter for screening examination for other mental health and behavioral disorders: Secondary | ICD-10-CM | POA: Diagnosis not present

## 2023-06-21 DIAGNOSIS — E1165 Type 2 diabetes mellitus with hyperglycemia: Secondary | ICD-10-CM | POA: Diagnosis not present

## 2023-07-09 ENCOUNTER — Encounter (INDEPENDENT_AMBULATORY_CARE_PROVIDER_SITE_OTHER): Payer: Self-pay | Admitting: Gastroenterology

## 2023-07-09 ENCOUNTER — Ambulatory Visit (INDEPENDENT_AMBULATORY_CARE_PROVIDER_SITE_OTHER): Payer: Medicare HMO | Admitting: Gastroenterology

## 2023-07-09 VITALS — BP 136/78 | HR 67 | Temp 97.9°F | Ht 66.75 in | Wt 237.7 lb

## 2023-07-09 DIAGNOSIS — K581 Irritable bowel syndrome with constipation: Secondary | ICD-10-CM

## 2023-07-09 DIAGNOSIS — K649 Unspecified hemorrhoids: Secondary | ICD-10-CM | POA: Diagnosis not present

## 2023-07-09 DIAGNOSIS — K589 Irritable bowel syndrome without diarrhea: Secondary | ICD-10-CM | POA: Insufficient documentation
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
## 2023-07-09 NOTE — Patient Instructions (Signed)
Start taking 2 capfuls/packs every day. If after two weeks there is no improvement, increase to 2 capfuls in AM and one at night. Stop magnesium for now. If not improving with Miralax, can restart it 1-2 times a week Explained presumed etiology of IBS symptoms. Patient was counseled about the benefit of implementing a low FODMAP to improve symptoms and recurrent episodes. A dietary list was provided to the patient. Also, the patient was counseled about the benefit of avoiding stressing situations and potential environmental triggers leading to symptomatology.

## 2023-07-13 DIAGNOSIS — H04123 Dry eye syndrome of bilateral lacrimal glands: Secondary | ICD-10-CM | POA: Diagnosis not present

## 2023-07-13 DIAGNOSIS — H524 Presbyopia: Secondary | ICD-10-CM | POA: Diagnosis not present

## 2023-07-13 DIAGNOSIS — E119 Type 2 diabetes mellitus without complications: Secondary | ICD-10-CM | POA: Diagnosis not present

## 2023-07-22 ENCOUNTER — Telehealth: Payer: Self-pay | Admitting: *Deleted

## 2023-07-22 DIAGNOSIS — E1165 Type 2 diabetes mellitus with hyperglycemia: Secondary | ICD-10-CM | POA: Diagnosis not present

## 2023-07-22 NOTE — Telephone Encounter (Signed)
Discussed with patient per Dr. Levon Hedger -  She can stop Miralax and go back on magnesium. She can buy Desitin or over the counter zinc oxide cream - for baby diaper rash. Can use it for a week.  Patient verbalized understanding.

## 2023-07-22 NOTE — Telephone Encounter (Signed)
She can stop Miralax and go back on magnesium. She can buy Desitin or over the counter zinc oxide cream - for baby diaper rash. Can use it for a week.

## 2023-07-22 NOTE — Telephone Encounter (Signed)
Left message to return call 

## 2023-07-22 NOTE — Telephone Encounter (Signed)
Pt seen 7/18 for constipation and IBS. She states she wasn't really constipated and just having cramping. She states cramping is better since following lowfod map diet. She was told to stop magnessium and take miralax twice daily she states and she tried double dose of miralax for 2 days and then went to once daily because it was tool much since then she has a stool every time she goes to bathroom. Hard to clean herself, has to wear a pad due to leakage. She stopped miralax yesterday. States she was told not to take fiber when I asked if she took fiber. She wants to know if she can go back on magnessium and also she has a rash on buttock from having multiple stools and would like to know what she can put on it.    [facility] [place]   [phone]

## 2023-08-22 DIAGNOSIS — E1165 Type 2 diabetes mellitus with hyperglycemia: Secondary | ICD-10-CM | POA: Diagnosis not present

## 2023-09-21 DIAGNOSIS — E1165 Type 2 diabetes mellitus with hyperglycemia: Secondary | ICD-10-CM | POA: Diagnosis not present

## 2023-10-21 DIAGNOSIS — E1165 Type 2 diabetes mellitus with hyperglycemia: Secondary | ICD-10-CM | POA: Diagnosis not present

## 2023-11-10 ENCOUNTER — Ambulatory Visit (INDEPENDENT_AMBULATORY_CARE_PROVIDER_SITE_OTHER): Payer: Medicare HMO | Admitting: Gastroenterology

## 2023-11-20 DIAGNOSIS — E1165 Type 2 diabetes mellitus with hyperglycemia: Secondary | ICD-10-CM | POA: Diagnosis not present

## 2023-11-26 DIAGNOSIS — Z809 Family history of malignant neoplasm, unspecified: Secondary | ICD-10-CM | POA: Diagnosis not present

## 2023-11-26 DIAGNOSIS — R32 Unspecified urinary incontinence: Secondary | ICD-10-CM | POA: Diagnosis not present

## 2023-11-26 DIAGNOSIS — Z8249 Family history of ischemic heart disease and other diseases of the circulatory system: Secondary | ICD-10-CM | POA: Diagnosis not present

## 2023-11-26 DIAGNOSIS — I10 Essential (primary) hypertension: Secondary | ICD-10-CM | POA: Diagnosis not present

## 2023-11-26 DIAGNOSIS — N390 Urinary tract infection, site not specified: Secondary | ICD-10-CM | POA: Diagnosis not present

## 2023-11-26 DIAGNOSIS — E669 Obesity, unspecified: Secondary | ICD-10-CM | POA: Diagnosis not present

## 2023-11-26 DIAGNOSIS — M545 Low back pain, unspecified: Secondary | ICD-10-CM | POA: Diagnosis not present

## 2023-11-26 DIAGNOSIS — E785 Hyperlipidemia, unspecified: Secondary | ICD-10-CM | POA: Diagnosis not present

## 2023-11-26 DIAGNOSIS — E119 Type 2 diabetes mellitus without complications: Secondary | ICD-10-CM | POA: Diagnosis not present

## 2023-11-26 DIAGNOSIS — Z7984 Long term (current) use of oral hypoglycemic drugs: Secondary | ICD-10-CM | POA: Diagnosis not present

## 2023-11-26 DIAGNOSIS — K589 Irritable bowel syndrome without diarrhea: Secondary | ICD-10-CM | POA: Diagnosis not present

## 2023-11-26 DIAGNOSIS — Z79890 Hormone replacement therapy (postmenopausal): Secondary | ICD-10-CM | POA: Diagnosis not present

## 2023-12-21 DIAGNOSIS — E1165 Type 2 diabetes mellitus with hyperglycemia: Secondary | ICD-10-CM | POA: Diagnosis not present

## 2024-01-20 DIAGNOSIS — E1165 Type 2 diabetes mellitus with hyperglycemia: Secondary | ICD-10-CM | POA: Diagnosis not present

## 2024-01-26 DIAGNOSIS — Z299 Encounter for prophylactic measures, unspecified: Secondary | ICD-10-CM | POA: Diagnosis not present

## 2024-01-26 DIAGNOSIS — I700 Atherosclerosis of aorta: Secondary | ICD-10-CM | POA: Diagnosis not present

## 2024-01-26 DIAGNOSIS — R32 Unspecified urinary incontinence: Secondary | ICD-10-CM | POA: Diagnosis not present

## 2024-01-26 DIAGNOSIS — E1159 Type 2 diabetes mellitus with other circulatory complications: Secondary | ICD-10-CM | POA: Diagnosis not present

## 2024-01-26 DIAGNOSIS — E1165 Type 2 diabetes mellitus with hyperglycemia: Secondary | ICD-10-CM | POA: Diagnosis not present

## 2024-01-26 DIAGNOSIS — E118 Type 2 diabetes mellitus with unspecified complications: Secondary | ICD-10-CM | POA: Diagnosis not present

## 2024-02-19 DIAGNOSIS — E1165 Type 2 diabetes mellitus with hyperglycemia: Secondary | ICD-10-CM | POA: Diagnosis not present

## 2024-04-27 ENCOUNTER — Other Ambulatory Visit: Payer: Self-pay | Admitting: Internal Medicine

## 2024-04-27 DIAGNOSIS — E118 Type 2 diabetes mellitus with unspecified complications: Secondary | ICD-10-CM | POA: Diagnosis not present

## 2024-04-27 DIAGNOSIS — Z6837 Body mass index (BMI) 37.0-37.9, adult: Secondary | ICD-10-CM | POA: Diagnosis not present

## 2024-04-27 DIAGNOSIS — Z299 Encounter for prophylactic measures, unspecified: Secondary | ICD-10-CM | POA: Diagnosis not present

## 2024-04-27 DIAGNOSIS — Z1231 Encounter for screening mammogram for malignant neoplasm of breast: Secondary | ICD-10-CM

## 2024-04-27 DIAGNOSIS — E1159 Type 2 diabetes mellitus with other circulatory complications: Secondary | ICD-10-CM | POA: Diagnosis not present

## 2024-04-27 DIAGNOSIS — I700 Atherosclerosis of aorta: Secondary | ICD-10-CM | POA: Diagnosis not present

## 2024-04-28 ENCOUNTER — Ambulatory Visit (HOSPITAL_COMMUNITY)
Admission: RE | Admit: 2024-04-28 | Discharge: 2024-04-28 | Disposition: A | Discharge status: Home / Self Care | Source: Ambulatory Visit | Attending: Internal Medicine | Admitting: Internal Medicine

## 2024-04-28 DIAGNOSIS — Z1231 Encounter for screening mammogram for malignant neoplasm of breast: Secondary | ICD-10-CM | POA: Insufficient documentation

## 2024-06-09 DIAGNOSIS — R69 Illness, unspecified: Secondary | ICD-10-CM | POA: Diagnosis not present

## 2024-06-27 DIAGNOSIS — M19072 Primary osteoarthritis, left ankle and foot: Secondary | ICD-10-CM | POA: Diagnosis not present

## 2024-06-27 DIAGNOSIS — M19071 Primary osteoarthritis, right ankle and foot: Secondary | ICD-10-CM | POA: Diagnosis not present

## 2024-06-27 DIAGNOSIS — M2011 Hallux valgus (acquired), right foot: Secondary | ICD-10-CM | POA: Diagnosis not present

## 2024-06-27 DIAGNOSIS — M792 Neuralgia and neuritis, unspecified: Secondary | ICD-10-CM | POA: Diagnosis not present

## 2024-06-30 DIAGNOSIS — E039 Hypothyroidism, unspecified: Secondary | ICD-10-CM | POA: Diagnosis not present

## 2024-06-30 DIAGNOSIS — R5383 Other fatigue: Secondary | ICD-10-CM | POA: Diagnosis not present

## 2024-06-30 DIAGNOSIS — Z79899 Other long term (current) drug therapy: Secondary | ICD-10-CM | POA: Diagnosis not present

## 2024-06-30 DIAGNOSIS — Z1331 Encounter for screening for depression: Secondary | ICD-10-CM | POA: Diagnosis not present

## 2024-06-30 DIAGNOSIS — E7800 Pure hypercholesterolemia, unspecified: Secondary | ICD-10-CM | POA: Diagnosis not present

## 2024-06-30 DIAGNOSIS — Z7189 Other specified counseling: Secondary | ICD-10-CM | POA: Diagnosis not present

## 2024-06-30 DIAGNOSIS — Z0000 Encounter for general adult medical examination without abnormal findings: Secondary | ICD-10-CM | POA: Diagnosis not present

## 2024-06-30 DIAGNOSIS — Z299 Encounter for prophylactic measures, unspecified: Secondary | ICD-10-CM | POA: Diagnosis not present

## 2024-06-30 DIAGNOSIS — Z1339 Encounter for screening examination for other mental health and behavioral disorders: Secondary | ICD-10-CM | POA: Diagnosis not present

## 2024-07-18 DIAGNOSIS — M19071 Primary osteoarthritis, right ankle and foot: Secondary | ICD-10-CM | POA: Diagnosis not present

## 2024-07-18 DIAGNOSIS — M2011 Hallux valgus (acquired), right foot: Secondary | ICD-10-CM | POA: Diagnosis not present

## 2024-07-18 DIAGNOSIS — M792 Neuralgia and neuritis, unspecified: Secondary | ICD-10-CM | POA: Diagnosis not present

## 2024-07-18 DIAGNOSIS — M19072 Primary osteoarthritis, left ankle and foot: Secondary | ICD-10-CM | POA: Diagnosis not present

## 2024-08-04 DIAGNOSIS — Z6837 Body mass index (BMI) 37.0-37.9, adult: Secondary | ICD-10-CM | POA: Diagnosis not present

## 2024-08-04 DIAGNOSIS — Z299 Encounter for prophylactic measures, unspecified: Secondary | ICD-10-CM | POA: Diagnosis not present

## 2024-08-04 DIAGNOSIS — E119 Type 2 diabetes mellitus without complications: Secondary | ICD-10-CM | POA: Diagnosis not present

## 2024-08-04 DIAGNOSIS — E7800 Pure hypercholesterolemia, unspecified: Secondary | ICD-10-CM | POA: Diagnosis not present

## 2024-08-04 DIAGNOSIS — R2681 Unsteadiness on feet: Secondary | ICD-10-CM | POA: Diagnosis not present

## 2024-08-12 DIAGNOSIS — M792 Neuralgia and neuritis, unspecified: Secondary | ICD-10-CM | POA: Diagnosis not present

## 2024-08-12 DIAGNOSIS — M19072 Primary osteoarthritis, left ankle and foot: Secondary | ICD-10-CM | POA: Diagnosis not present

## 2024-08-12 DIAGNOSIS — M2011 Hallux valgus (acquired), right foot: Secondary | ICD-10-CM | POA: Diagnosis not present

## 2024-08-12 DIAGNOSIS — M19071 Primary osteoarthritis, right ankle and foot: Secondary | ICD-10-CM | POA: Diagnosis not present

## 2024-10-05 ENCOUNTER — Encounter (INDEPENDENT_AMBULATORY_CARE_PROVIDER_SITE_OTHER): Payer: Self-pay | Admitting: Gastroenterology

## 2024-11-09 DIAGNOSIS — E119 Type 2 diabetes mellitus without complications: Secondary | ICD-10-CM | POA: Diagnosis not present

## 2024-11-09 DIAGNOSIS — E039 Hypothyroidism, unspecified: Secondary | ICD-10-CM | POA: Diagnosis not present

## 2024-11-09 DIAGNOSIS — R42 Dizziness and giddiness: Secondary | ICD-10-CM | POA: Diagnosis not present

## 2024-11-09 DIAGNOSIS — Z299 Encounter for prophylactic measures, unspecified: Secondary | ICD-10-CM | POA: Diagnosis not present
